# Patient Record
Sex: Female | Born: 1975 | Race: Asian | Marital: Married | Smoking: Never smoker
Health system: Southern US, Community
[De-identification: ages and names within clinical notes are randomized; demographics above are authoritative.]

## PROBLEM LIST (undated history)

## (undated) DIAGNOSIS — R112 Nausea with vomiting, unspecified: Secondary | ICD-10-CM

## (undated) DIAGNOSIS — B029 Zoster without complications: Secondary | ICD-10-CM

## (undated) DIAGNOSIS — L659 Nonscarring hair loss, unspecified: Secondary | ICD-10-CM

## (undated) DIAGNOSIS — Z9889 Other specified postprocedural states: Secondary | ICD-10-CM

## (undated) DIAGNOSIS — T8859XA Other complications of anesthesia, initial encounter: Secondary | ICD-10-CM

## (undated) DIAGNOSIS — K219 Gastro-esophageal reflux disease without esophagitis: Secondary | ICD-10-CM

## (undated) DIAGNOSIS — Z803 Family history of malignant neoplasm of breast: Secondary | ICD-10-CM

## (undated) DIAGNOSIS — M5010 Cervical disc disorder with radiculopathy, unspecified cervical region: Secondary | ICD-10-CM

## (undated) DIAGNOSIS — R61 Generalized hyperhidrosis: Secondary | ICD-10-CM

## (undated) DIAGNOSIS — N941 Unspecified dyspareunia: Secondary | ICD-10-CM

## (undated) HISTORY — DX: Nonscarring hair loss, unspecified: L65.9

## (undated) HISTORY — DX: Gastro-esophageal reflux disease without esophagitis: K21.9

## (undated) HISTORY — PX: OTHER SURGICAL HISTORY: SHX169

## (undated) HISTORY — PX: BLADDER EXTROPHY RECONSTRUCTION PELVIC SAGITTAL OSTEOTOMY: SHX1236

---

## 2003-06-05 DIAGNOSIS — R87629 Unspecified abnormal cytological findings in specimens from vagina: Secondary | ICD-10-CM

## 2003-06-05 HISTORY — DX: Unspecified abnormal cytological findings in specimens from vagina: R87.629

## 2007-12-04 ENCOUNTER — Inpatient Hospital Stay (HOSPITAL_COMMUNITY): Admission: AD | Admit: 2007-12-04 | Discharge: 2007-12-04 | Payer: Self-pay | Admitting: Obstetrics and Gynecology

## 2008-04-13 ENCOUNTER — Inpatient Hospital Stay (HOSPITAL_COMMUNITY): Admission: AD | Admit: 2008-04-13 | Discharge: 2008-04-13 | Payer: Self-pay | Admitting: Obstetrics and Gynecology

## 2008-07-22 ENCOUNTER — Inpatient Hospital Stay (HOSPITAL_COMMUNITY): Admission: AD | Admit: 2008-07-22 | Discharge: 2008-07-25 | Payer: Self-pay | Admitting: Obstetrics and Gynecology

## 2008-07-25 ENCOUNTER — Inpatient Hospital Stay (HOSPITAL_COMMUNITY): Admission: AD | Admit: 2008-07-25 | Discharge: 2008-07-25 | Payer: Self-pay | Admitting: Obstetrics and Gynecology

## 2010-09-19 LAB — CBC
MCHC: 33.8 g/dL (ref 30.0–36.0)
MCHC: 34.4 g/dL (ref 30.0–36.0)
MCV: 89.4 fL (ref 78.0–100.0)
MCV: 90.2 fL (ref 78.0–100.0)
Platelets: 215 10*3/uL (ref 150–400)
RBC: 4.37 MIL/uL (ref 3.87–5.11)
RDW: 14.4 % (ref 11.5–15.5)
RDW: 14.9 % (ref 11.5–15.5)
WBC: 15 10*3/uL — ABNORMAL HIGH (ref 4.0–10.5)

## 2010-09-19 LAB — URINE CULTURE: Special Requests: NEGATIVE

## 2010-09-19 LAB — CREATININE, SERUM
Creatinine, Ser: 0.76 mg/dL (ref 0.4–1.2)
GFR calc Af Amer: >60 mL/min (ref >60)

## 2010-09-19 LAB — RPR: RPR Ser Ql: NONREACTIVE

## 2010-10-17 NOTE — Discharge Summary (Signed)
NAMEKENADEE, GATES                 ACCOUNT NO.:  [id number]   MEDICAL RECORD NO.:  [id number]          PATIENT TYPE:  INP   LOCATION:  9108                          FACILITY:  [facility]   PHYSICIAN:  Janine Limbo, M.D.DATE OF BIRTH:  23-Oct-1975   DATE OF ADMISSION:  07/22/2008  DATE OF DISCHARGE:  07/25/2008                               DISCHARGE SUMMARY   Ms. Fuller is a 35 year old gravida 1 now para 1-0-0-1 who delivered a  daughter, Truitt Leep on February 19 at 12 noon who weighed 6 pounds 6 ounces  with Apgars of 8 at 1 minute and 9 at 5 minutes.  She was delivered by  vacuum extraction.   ADMISSION DIAGNOSES:  1. Singleton intrauterine pregnancy at 38.6 weeks.  2. Penicillin allergy.  3. Frequent urinary tract infections with a long history of      genitourinary discomfort and pain.  4. Active labor.   DISCHARGE DIAGNOSES:  1. Two days status post vacuum extraction at term secondary to      prolonged second stage.  2. Midline episiotomy with third-degree laceration extension.  3. Difficult repair with vaginal packing secondary to marked tissue      friability.  4. Endometriosis, postpartum.  5. Urinary retention.  6. Anemia.   PERTINENT LABS:  Ms. Hypolite is A positive with rubella immune status.  White blood cell count 15.0 (10.3), hemoglobin 7.1 (13.2), hematocrit  20.7 ( 39.1), platelets 128,000 (215,000).   COURSE OF STAY:  Ms. Cantero arrived on February 18 in active labor.  She  progressed to delivery at noon on 19th after 3 hours of pushing with a  vacuum extraction.  Delivery by Dr. Pennie Rushing.  On 19th and 20th, she had  issues with pain control and developed a fever on 19th and was treated  with clindamycin and gentamicin.  On 20th, she had a problem with  urinary retention and Dr. Pennie Rushing placed a Foley catheter to be left in  overnight.  On 21st, the Foley catheter was removed and the patient was  able to successfully void several times before leaving to go home.   Exam  on day of discharge, her vital signs were 97.9, pulse 109, respiratory  rate 18, blood pressure 94/54.  She was not having any dizziness without  hemoglobin of 7.1.  Her lungs were clear to auscultation bilaterally.  Her heart had a regular rate and rhythm without murmur.  Her breasts  were soft and filling.  Her nipples were intact.  Her abdomen was soft.  Her fundus was firm -1 cm below the umbilicus.  Her perineum was intact  with minimal swelling and her lochia was light.  She did have mild edema  of extremities x4 with DTRs normal at +1, +1, and negative Homans  bilaterally.   DISCHARGE MEDICATIONS:  1. Motrin 600 mg.  2. Percocet.  3. Integra F iron supplementation.  4. Prenatal vitamins which she already has.  5. Over-the-counter Colace.   Ms. Kitner was provided with the postpartum discharge instruction  booklet which was reviewed with her with specific emphasis on the  danger  signs of postpartum and constipation prevention measures.  Ms. Atilano is  to follow up with Dr. Pennie Rushing in the office in approximately 1-1/2 weeks  and to call us sooner p.r.n. if any problems.  She seemed to have  received full benefit of her hospitalization for the birth of her  daughter, Truitt Leep.      Eulogio Bear, CNM      Janine Limbo, M.D.  Electronically Signed    JM/MEDQ  D:  07/25/2008  T:  07/26/2008  Job:  [id number]

## 2010-10-17 NOTE — Op Note (Signed)
[name], [name]                 ACCOUNT NO.:  [id number]   MEDICAL RECORD NO.:  [id number]          PATIENT TYPE:  MAT   LOCATION:  MATC                          FACILITY:  [facility]   PHYSICIAN:  Hal Morales, M.D.DATE OF BIRTH:  March 17, 1976   DATE OF PROCEDURE:  07/24/2008  DATE OF DISCHARGE:                               OPERATIVE REPORT   PREOPERATIVE DIAGNOSES:  Intrauterine pregnancy at term, prolonged  second stage labor.   POSTOPERATIVE DIAGNOSES:  Intrauterine pregnancy at term, prolonged  second stage labor.   OPERATION:  Kiwi vacuum-assisted vaginal delivery, second-degree midline  episiotomy with extension to third-degree, repair of third-degree  perineal laceration, vaginal packing.   SURGEON:  Vanessa P. Haygood, MD   ANESTHESIA:  Epidural.   ESTIMATED BLOOD LOSS:  1000 mL.   COMPLICATIONS:  Multiple vaginal lacerations which occurred during  pushing and bled significantly.  The tissue was so fragile that it would  not except and hold sutures.   FINDINGS:  The patient was delivered of a female infant whose name is  Bhutan, weighing 6 pounds 6 ounces with Apgars of 8 and nine at 1 at five  minutes respectively.   PROCEDURE:  The patient had been pushing for approximately 3 hours  though she admitted during the first hour of pushing, she did not  understand how to push effectively.  At this time, the vertex was at the  +3 to +4 station and in the right occiput anterior position.  The Foley  catheter had remained in place until the decision had been made to  proceed with vacuum-assisted vaginal delivery.  At least 2 hour pushing  marked, a long discussion had been held with the patient and her husband  and her mother concerning options for further management, which included  continued pushing, an attempt then at vacuum-assisted vaginal delivery  and cesarean section.  The risks and benefits of each of those options  was explained in detail.  She had made  decision that she would continue  pushing for an additional hour and pushed effectively to bring the  vertex to the aforementioned +3 to +4 station.  She did seem to be  unable to make further progress with further pushing.  The Foley  catheter was removed from the patient who was already in the lithotomy  position.  Perineum was cleansed.  The Kiwi vacuum extractor was then  placed over the fetal vertex and next contraction with a combination of  suction and maternal expulsive effort.  Further movement was achieved.  It was noted, however, that there was significant bleeding from the  aforementioned vaginal tear and there was blood within the suction  tubing which was limiting the ability of the Kiwi to maintain its seal.  Another fresh Kiwi vacuum extractor was then used and over the next  several contractions, the fetal vertex was advanced further.  Once the  vertex began to extend the perineum, it became clear that the perineum  became the limiting factor for delivery and a second-degree midline  episiotomy was performed.  Once that occurred, fetal vertex  was  delivered over the second-degree episiotomy.  The remainder of the  infant was delivered with a combination of gentle traction and maternal  expulsive efforts.  The infant was suctioned, and the cord clamped and  then cut by the father of the baby.  The infant was given to the mother  for initial infant bonding.  The appropriate cord blood was drawn from  the umbilical cord.  An assessment of the perineum was made while  awaiting for the placenta to spontaneously detach.  Once that detachment  had occurred, gentle traction and maternal expulsive efforts allowed  delivery of the placenta.  It appeared to be completely intact.  No  cervical lacerations could be noted, but multiple small lacerations of  the vaginal mucosa were noted.  There was a second-degree laceration of  the vaginal mucosa, which extended from the episiotomy  incision.  Initial repair of that the area was achieved with a running interlocking  suture with continued problems with the suture pulling through the  vaginal tissue.  That the area was reapproximated, however, there were  several very small areas where the vaginal mucosa was essentially  denuded.  The third- degree laceration was repaired first by repairing  the rectal sphincter with 2-0 Vicryl.  Then, repairing the mucosa of the  rectum onto the perineum with subcuticular sutures of 3-0 Vicryl.  The  subcutaneous tissue of the perineum was repaired with interrupted  sutures of 2-0 Vicryl and the completion of the skin incision with  subcuticular sutures of 3-0 Vicryl was undertaken.  At that time, the  decision was made to place vaginal packing to achieve hemostasis in the  vagina because of the marked fragility of the vaginal mucosa.  This was  achieved with 2-inch plain vaginal packing with adequate hemostasis.  A  Foley catheter was replaced in the bladder to avoid urinary retention in  the event that there should be significant perineal swelling and  obstruction from the vaginal packing.  At this time, hemostasis was  noted to be adequate and the patient remained in the labor, delivery,  and recovery area for initial recovery.  The infant went to the full-  term nursery.      Hal Morales, M.D.  Electronically Signed     VPH/MEDQ  D:  07/24/2008  T:  07/25/2008  Job:  [id number]

## 2010-10-17 NOTE — H&P (Signed)
NAMETIFFANY, [name]                 ACCOUNT NO.:  [id number]   MEDICAL RECORD NO.:  [id number]          PATIENT TYPE:  INP   LOCATION:  9174                          FACILITY:  [facility]   PHYSICIAN:  Janine Limbo, M.D.DATE OF BIRTH:  Feb 12, 1976   DATE OF ADMISSION:  07/22/2008  DATE OF DISCHARGE:                              HISTORY & PHYSICAL   This is a 35 year old gravida 1, para 0 at 38-6/7 weeks who presents to  the office with contractions every 5 minutes for 3 hours.  Cervix 2 days  ago was 1 cm.  She denies leaking or bleeding and reports positive fetal  movement.  Pregnancy has been followed by Dr. Pennie Rushing and remarkable  for:  1. History of first trimester spotting  2. Irregular cycles.  3. EIF in the fetal heart with normal first-trimester testing.   ALLERGIES:  PENICILLIN.   OB history is negative.   Medical history is remarkable for abnormal Pap in previous years with no  treatment.  Childhood varicella.   Surgical history is remarkable for wisdom teeth at age of 48.   Family history is remarkable for several family members with heart  disease and varicosities.  Several family members with type 2 diabetes.  Several family members with hypothyroidism and mother with breast cancer  and skin cancer.   Genetic history is negative.   SOCIAL HISTORY:  The patient is married to [name] who is works as  MD and she works as an Pensions consultant.  She is of the Hindu faith.  She denies  any alcohol, tobacco, or drug use.   Prenatal Labs:  Hemoglobin 14.5, platelets 248.  Blood type A+, antibody  screen negative, RPR nonreactive, rubella immune, hepatitis negative,  HIV negative.  Pap test normal.  Gonorrhea negative.  Chlamydia  negative.   HISTORY OF CURRENT PREGNANCY:  The patient entered care at 63 weeks'  gestation.  She was treated for UTI in the first trimester, first  trimester screen was normal as was AFP.  Anatomy ultrasound at 19 weeks  was normal except for an  EIF that was seen in the fetal heart, positive  nasal bone was seen, cervix was normal, and Glucola at 26 weeks was  normal.  She was placed on Macrobid for UTI suppression.  She had an  ultrasound for size less than dates at 36 weeks that showed 69  percentile growth with normal fluid.  She had group B strep negative at  term.  She had another ultrasound at 38 weeks showing normal AFI and she  presents today in labor.   OBJECTIVE:  HEENT:  Normal limits.  Thyroid normal, not enlarged.  CHEST:  Clear to auscultation.  HEART:  Regular rate and rhythm.  ABDOMEN:  Gravid.  Fetal heart rate is 150.  Cervix with a difficult  exam at 4 cm, 90% effaced, -2 station with a vertex presentation.  Membranes are intact.  EXTREMITIES:  Normal limits.   ASSESSMENT:  1. Intrauterine pregnancy at 38-6/7 weeks.  2. Early active labor.  3. Desires epidural.   PLAN:  1.  Admit to birthing suites per Dr. Stefano Gaul.  2. Epidural.  3. Routine MD orders.      Marie L. Williams, C.N.M.      Janine Limbo, M.D.  Electronically Signed    MLW/MEDQ  D:  07/22/2008  T:  07/23/2008  Job:  [id number]

## 2011-03-06 LAB — URINE MICROSCOPIC-ADD ON

## 2011-03-06 LAB — CBC
Hemoglobin: 12.5
MCV: 91.9
RBC: 4.11
WBC: 8.8

## 2011-03-06 LAB — URINALYSIS, ROUTINE W REFLEX MICROSCOPIC: Ketones, ur: 15 — AB

## 2011-03-06 LAB — WET PREP, GENITAL
Clue Cells Wet Prep HPF POC: NONE SEEN
Yeast Wet Prep HPF POC: NONE SEEN

## 2013-03-04 ENCOUNTER — Ambulatory Visit: Payer: Self-pay | Admitting: Radiology

## 2014-01-02 HISTORY — PX: ABDOMINAL SURGERY: SHX537

## 2014-01-09 ENCOUNTER — Ambulatory Visit: Payer: Self-pay

## 2014-01-09 LAB — URINALYSIS, COMPLETE
BLOOD: NEGATIVE
Bacteria: NONE SEEN
Bilirubin,UR: NEGATIVE
Glucose,UR: NEGATIVE mg/dL (ref 0–75)
KETONE: NEGATIVE
NITRITE: NEGATIVE
PH: 7 (ref 4.5–8.0)
PROTEIN: NEGATIVE
RBC,UR: 2 /HPF (ref 0–5)
SPECIFIC GRAVITY: 1.015 (ref 1.003–1.030)
Squamous Epithelial: 7
WBC UR: 19 /HPF (ref 0–5)

## 2014-01-09 LAB — CBC WITH DIFFERENTIAL/PLATELET
BASOS ABS: 0.1 10*3/uL (ref 0.0–0.1)
BASOS PCT: 1.1 %
EOS ABS: 0.4 10*3/uL (ref 0.0–0.7)
Eosinophil %: 6.6 %
HCT: 44.6 % (ref 35.0–47.0)
HGB: 14.7 g/dL (ref 12.0–16.0)
LYMPHS ABS: 2.5 10*3/uL (ref 1.0–3.6)
Lymphocyte %: 44 %
MCH: 30.1 pg (ref 26.0–34.0)
MCHC: 33.1 g/dL (ref 32.0–36.0)
MCV: 91 fL (ref 80–100)
Monocyte #: 0.4 x10 3/mm (ref 0.2–0.9)
Monocyte %: 6.3 %
Neutrophil #: 2.4 10*3/uL (ref 1.4–6.5)
Neutrophil %: 42 %
Platelet: 252 10*3/uL (ref 150–440)
RBC: 4.89 10*6/uL (ref 3.80–5.20)
RDW: 13.3 % (ref 11.5–14.5)
WBC: 5.8 10*3/uL (ref 3.6–11.0)

## 2014-01-09 LAB — BASIC METABOLIC PANEL
ANION GAP: 10 (ref 7–16)
BUN: 7 mg/dL (ref 7–18)
CHLORIDE: 104 mmol/L (ref 98–107)
Calcium, Total: 9.1 mg/dL (ref 8.5–10.1)
Co2: 24 mmol/L (ref 21–32)
Creatinine: 0.89 mg/dL (ref 0.60–1.30)
GLUCOSE: 97 mg/dL (ref 65–99)
Osmolality: 274 (ref 275–301)
Potassium: 4.6 mmol/L (ref 3.5–5.1)
SODIUM: 138 mmol/L (ref 136–145)

## 2014-12-07 ENCOUNTER — Encounter: Payer: Self-pay | Admitting: Pulmonary Disease

## 2014-12-07 ENCOUNTER — Ambulatory Visit (INDEPENDENT_AMBULATORY_CARE_PROVIDER_SITE_OTHER): Payer: PRIVATE HEALTH INSURANCE | Admitting: Pulmonary Disease

## 2014-12-07 ENCOUNTER — Institutional Professional Consult (permissible substitution): Payer: Self-pay | Admitting: Pulmonary Disease

## 2014-12-07 ENCOUNTER — Ambulatory Visit
Admission: RE | Admit: 2014-12-07 | Discharge: 2014-12-07 | Disposition: A | Discharge status: Home / Self Care | Payer: PRIVATE HEALTH INSURANCE | Source: Ambulatory Visit | Attending: Pulmonary Disease | Admitting: Pulmonary Disease

## 2014-12-07 VITALS — BP 118/64 | HR 71 | Ht 60.0 in | Wt 123.0 lb

## 2014-12-07 DIAGNOSIS — R058 Other specified cough: Secondary | ICD-10-CM | POA: Insufficient documentation

## 2014-12-07 DIAGNOSIS — J309 Allergic rhinitis, unspecified: Secondary | ICD-10-CM | POA: Insufficient documentation

## 2014-12-07 DIAGNOSIS — R05 Cough: Secondary | ICD-10-CM

## 2014-12-07 DIAGNOSIS — R059 Cough, unspecified: Secondary | ICD-10-CM

## 2014-12-07 DIAGNOSIS — K219 Gastro-esophageal reflux disease without esophagitis: Secondary | ICD-10-CM

## 2014-12-07 DIAGNOSIS — J3089 Other allergic rhinitis: Secondary | ICD-10-CM

## 2014-12-07 LAB — PULMONARY FUNCTION TEST
DL/VA % PRED: 132 %
DL/VA: 5.61 ml/min/mmHg/L
DLCO UNC % PRED: 105 %
DLCO UNC: 19.97 ml/min/mmHg
FEF 25-75 Post: 2.99 L/sec
FEF 25-75 Pre: 2.92 L/sec
FEF2575-%Change-Post: 2 %
FEV1-%Change-Post: 1 %
FEV1-PRE: 2.35 L
FEV1-Post: 2.39 L
FEV1FVC-%CHANGE-POST: -1 %
FEV6-%Change-Post: 2 %
FEV6-PRE: 2.67 L
FEV6-Post: 2.74 L
FEV6FVC-%CHANGE-POST: 0 %
FVC-%CHANGE-POST: 2 %
FVC-PRE: 2.67 L
FVC-Post: 2.74 L
POST FEV1/FVC RATIO: 87 %
POST FEV6/FVC RATIO: 100 %
Pre FEV1/FVC ratio: 88 %
Pre FEV6/FVC Ratio: 100 %
RV % pred: 100 %
RV: 1.32 L
TLC % PRED: 90 %
TLC: 4.04 L

## 2014-12-07 NOTE — Progress Notes (Signed)
PFT performed today. 

## 2014-12-07 NOTE — Assessment & Plan Note (Addendum)
She has acid reflux contributing to her cough based on the fact that she can feel heartburn and her cough is worse when she's lying flat.  Plan: Pepcid over-the-counter twice a day Acid reflux lifestyle modifications reviewed Raise the head of the bed 30

## 2014-12-07 NOTE — Addendum Note (Signed)
Addended by: Alease FrameARTER, SONYA S on: 12/07/2014 05:02 PM   Modules accepted: Orders

## 2014-12-07 NOTE — Addendum Note (Signed)
Addended by: Alease FrameARTER, Kendallyn Lippold S on: 12/07/2014 03:39 PM   Modules accepted: Orders

## 2014-12-07 NOTE — Assessment & Plan Note (Signed)
I explained to her that I thought it would be reasonable for her to see an allergist because of the progressive nature of her allergies. However in the meantime I have recommended conservative therapy as outlined below: Generic Zyrtec daily Nasacort daily Saline rinses when symptoms are worse

## 2014-12-07 NOTE — Assessment & Plan Note (Signed)
Her lung exam is clear today, a chest x-ray from a couple years ago was personally reviewed and there is no evidence of lung disease. So do not think that she has a lung disease which is contributing to her cough. She has postnasal drip, ongoing laryngeal irritation with hoarseness, and likely acid reflux which are all contribute to her cough.  Because this problem has been progressive over the last year and a half I would like to get pulmonary function testing to ensure there is no evidence of an underlying lung disease. Further, I like to repeat a chest x-ray as I have not been able to see a recent one aside from the partial images from 2014.  Plan: Chest x-ray Pulmonary function testing Voice rest was encouraged for the laryngeal irritation Allergic rhinitis treatment as outlined below Acid reflux treatment as outlined below Follow-up when necessary

## 2014-12-07 NOTE — Patient Instructions (Signed)
For the sinus congestion related to the cough: Use Neil Med rinses with distilled water at least twice per day using the instructions on the package. 1/2 hour after using the [facility] rinse, use Nasacort two puffs in each nostril once per day.  Remember that the Nasacort can take 1-2 weeks to work after regular use. Use generic zyrtec (cetirizine) every day.  If this doesn't help, then stop taking it and use chlorpheniramine-phenylephrine combination tablets.  You need to try to suppress your cough to allow your larynx (voice box) to heal.  For three days don't talk, laugh, sing, or clear your throat. Do everything you can to suppress the cough during this time. Use hard candies (sugarless Jolly Ranchers) or non-mint or non-menthol containing cough drops during this time to soothe your throat.  Use a cough suppressant (Delsym or what I have prescribed you) around the clock during this time.  After three days, gradually increase the use of your voice and back off on the cough suppressants.   Follow the acid reflux lifestyle modification we gave you  We will call you with the results of the Chest X-ray  Call us if you are not getting better and we will see you back

## 2014-12-07 NOTE — Progress Notes (Signed)
Subjective:    Patient ID: [name], female    DOB: 06/24/1975, 39 y.o.   MRN: [id number]  HPI Chief Complaint  Patient presents with  . Advice Only    Pt referred by Dr. Allena KatzPatel for chronic cough X2 years.  Pt states she has coughing spells that last up to 6 weeks at a time.      This is a pleasant nonsmoker who comes to my clinic today for evaluation of chronic cough which is happening for the last 2 years. She states that she never had a lung problem as a child but she does remember having some respiratory infections. She also had an anaphylactic reaction to penicillin as a child.  She states that for the last 1-2 years typically will happen is her children will get a respiratory infection that only last for 3 or 4 days but then she will get the infection. She says this typically starts with sinus congestion as well as some phlegm in her throat. This will lead to a cough which is initially productive of mucus but then later a deep dry cough which last for weeks. She says the cough is nearly constant though it may be a little bit worse when she lies flat at night. Sometimes if she props her head of her bed up it will lead to a 25% improvement in the cough but will still persist while lying flat. The cough is not related to eating as far she can tell. She says that when she talks a lot she thinks this may make the cough worse. She also has noted hoarseness with this. Last year she was evaluated by an ear nose and throat physician and no clear cause of the cough could be identified. She was treated with albuterol but she said that this did not help significantly. She does have allergy rhinitis and she takes Zyrtec for this. This did help with the cough some a year ago but lately this has not been helping quite some much. She did try Allegra but that was not quite as good.  She says the allergic rhinitis has been persistent for the last 1-2 years and she has yet to see an allergist. She also notes  heartburn symptoms. She says that certain meals may make this a bit worse. Cough may also be worsened by popcorn from time to time.  No past medical history on file.   Family History  Problem Relation Age of Onset  . Heart disease Father [id number]  . Heart disease Paternal Grandmother   . Heart disease Paternal Uncle   . Cancer Mother     breast     History   Social History  . Marital Status: Married    Spouse Name: N/A  . Number of Children: N/A  . Years of Education: N/A   Occupational History  . Not on file.   Social History Main Topics  . Smoking status: Never Smoker   . Smokeless tobacco: Never Used  . Alcohol Use: Not on file  . Drug Use: Not on file  . Sexual Activity: Not on file   Other Topics Concern  . Not on file   Social History Narrative  . No narrative on file     Allergies  Allergen Reactions  . Penicillins Anaphylaxis     No outpatient prescriptions prior to visit.   No facility-administered medications prior to visit.       Review of Systems  Constitutional: Negative for fever  and unexpected weight change.  HENT: Positive for congestion. Negative for dental problem, ear pain, nosebleeds, postnasal drip, rhinorrhea, sinus pressure, sneezing, sore throat and trouble swallowing.   Eyes: Negative for redness and itching.  Respiratory: Positive for cough. Negative for chest tightness, shortness of breath and wheezing.   Cardiovascular: Negative for palpitations and leg swelling.  Gastrointestinal: Negative for nausea and vomiting.  Genitourinary: Negative for dysuria.  Musculoskeletal: Negative for joint swelling.  Skin: Negative for rash.  Neurological: Negative for headaches.  Hematological: Does not bruise/bleed easily.  Psychiatric/Behavioral: Negative for dysphoric mood. The patient is not nervous/anxious.        Objective:   Physical Exam Filed Vitals:   12/07/14 1425  BP: 118/64  Pulse: 71  Height: 5' (1.524 m)  Weight: 123 lb  (55.792 kg)  SpO2: 100%  RA  Gen: well appearing, no acute distress HENT: NCAT, OP clear, neck supple without masses Eyes: PERRL, EOMi Lymph: no cervical lymphadenopathy PULM: CTA B CV: RRR, no mgr, no JVD GI: BS+, soft, nontender, no hsm Derm: no rash or skin breakdown MSK: normal bulk and tone Neuro: A&Ox4, CN II-XII intact, strength 5/5 in all 4 extremities Psyche: normal mood and affect  2014 chest x-ray (shoulder?) Images reviewed, only partial images of the lung seen but there is no clear evidence of lung disease noted        Assessment & Plan:  Upper airway cough syndrome Her lung exam is clear today, a chest x-ray from a couple years ago was personally reviewed and there is no evidence of lung disease. So do not think that she has a lung disease which is contributing to her cough. She has postnasal drip, ongoing laryngeal irritation with hoarseness, and likely acid reflux which are all contribute to her cough.  Because this problem has been progressive over the last year and a half I would like to get pulmonary function testing to ensure there is no evidence of an underlying lung disease. Further, I like to repeat a chest x-ray as I have not been able to see a recent one aside from the partial images from 2014.  Plan: Chest x-ray Pulmonary function testing Voice rest was encouraged for the laryngeal irritation Allergic rhinitis treatment as outlined below Acid reflux treatment as outlined below Follow-up when necessary  Allergic rhinitis I explained to her that I thought it would be reasonable for her to see an allergist because of the progressive nature of her allergies. However in the meantime I have recommended conservative therapy as outlined below: Generic Zyrtec daily Nasacort daily Saline rinses when symptoms are worse  GERD (gastroesophageal reflux disease) She has acid reflux contributing to her cough based on the fact that she can feel heartburn and her  cough is worse when she's lying flat.  Plan: Pepcid over-the-counter twice a day Acid reflux lifestyle modifications reviewed Raise the head of the bed 30    Current outpatient prescriptions:  .  cetirizine (ZYRTEC) 10 MG tablet, Take 10 mg by mouth daily as needed for allergies., Disp: , Rfl:

## 2014-12-08 ENCOUNTER — Telehealth: Payer: Self-pay | Admitting: Pulmonary Disease

## 2014-12-08 NOTE — Telephone Encounter (Signed)
Patient calling back for Xray result. Patient notified.  No questions or concerns at this time. Nothing further needed.

## 2017-11-07 ENCOUNTER — Ambulatory Visit
Admission: RE | Admit: 2017-11-07 | Discharge: 2017-11-07 | Disposition: A | Discharge status: Home / Self Care | Payer: Self-pay | Source: Ambulatory Visit | Attending: Diagnostic Radiology | Admitting: Diagnostic Radiology

## 2017-11-07 ENCOUNTER — Other Ambulatory Visit (HOSPITAL_COMMUNITY): Payer: Self-pay | Admitting: Diagnostic Radiology

## 2017-11-07 DIAGNOSIS — N63 Unspecified lump in unspecified breast: Secondary | ICD-10-CM

## 2017-11-08 ENCOUNTER — Other Ambulatory Visit (HOSPITAL_COMMUNITY): Payer: Self-pay | Admitting: Diagnostic Radiology

## 2017-11-08 ENCOUNTER — Ambulatory Visit
Admission: RE | Admit: 2017-11-08 | Discharge: 2017-11-08 | Disposition: A | Discharge status: Home / Self Care | Payer: Self-pay | Source: Ambulatory Visit | Attending: Diagnostic Radiology | Admitting: Diagnostic Radiology

## 2017-11-08 ENCOUNTER — Inpatient Hospital Stay
Admission: RE | Admit: 2017-11-08 | Discharge: 2017-11-08 | Disposition: A | Discharge status: Home / Self Care | Payer: Self-pay | Source: Ambulatory Visit | Attending: Diagnostic Radiology | Admitting: Diagnostic Radiology

## 2017-11-08 DIAGNOSIS — N63 Unspecified lump in unspecified breast: Secondary | ICD-10-CM

## 2018-02-13 ENCOUNTER — Ambulatory Visit: Payer: PRIVATE HEALTH INSURANCE | Admitting: Internal Medicine

## 2021-05-17 ENCOUNTER — Other Ambulatory Visit (HOSPITAL_COMMUNITY): Payer: Self-pay | Admitting: Internal Medicine

## 2021-06-09 ENCOUNTER — Other Ambulatory Visit: Payer: Self-pay

## 2021-06-09 ENCOUNTER — Ambulatory Visit
Admission: RE | Admit: 2021-06-09 | Discharge: 2021-06-09 | Disposition: A | Discharge status: Home / Self Care | Payer: PRIVATE HEALTH INSURANCE | Source: Ambulatory Visit | Attending: Internal Medicine | Admitting: Internal Medicine

## 2021-06-09 DIAGNOSIS — Z8249 Family history of ischemic heart disease and other diseases of the circulatory system: Secondary | ICD-10-CM | POA: Insufficient documentation

## 2021-06-09 DIAGNOSIS — E7800 Pure hypercholesterolemia, unspecified: Secondary | ICD-10-CM | POA: Insufficient documentation

## 2021-06-09 DIAGNOSIS — Z136 Encounter for screening for cardiovascular disorders: Secondary | ICD-10-CM | POA: Insufficient documentation

## 2021-07-04 ENCOUNTER — Other Ambulatory Visit: Payer: Self-pay | Admitting: Obstetrics and Gynecology

## 2021-07-04 DIAGNOSIS — R1032 Left lower quadrant pain: Secondary | ICD-10-CM

## 2021-07-07 ENCOUNTER — Other Ambulatory Visit: Payer: PRIVATE HEALTH INSURANCE

## 2021-07-10 ENCOUNTER — Ambulatory Visit
Admission: RE | Admit: 2021-07-10 | Discharge: 2021-07-10 | Disposition: A | Discharge status: Home / Self Care | Payer: BC Managed Care – PPO | Source: Ambulatory Visit | Attending: Obstetrics and Gynecology | Admitting: Obstetrics and Gynecology

## 2021-07-10 ENCOUNTER — Other Ambulatory Visit: Payer: PRIVATE HEALTH INSURANCE

## 2021-07-10 DIAGNOSIS — R1032 Left lower quadrant pain: Secondary | ICD-10-CM

## 2022-06-08 ENCOUNTER — Ambulatory Visit (INDEPENDENT_AMBULATORY_CARE_PROVIDER_SITE_OTHER): Payer: BC Managed Care – PPO | Admitting: Neurosurgery

## 2022-06-08 ENCOUNTER — Encounter: Payer: Self-pay | Admitting: Neurosurgery

## 2022-06-08 ENCOUNTER — Telehealth: Payer: Self-pay

## 2022-06-08 VITALS — BP 115/74 | HR 98 | Ht 60.0 in | Wt 133.0 lb

## 2022-06-08 DIAGNOSIS — G545 Neuralgic amyotrophy: Secondary | ICD-10-CM

## 2022-06-08 DIAGNOSIS — R200 Anesthesia of skin: Secondary | ICD-10-CM

## 2022-06-08 DIAGNOSIS — M5412 Radiculopathy, cervical region: Secondary | ICD-10-CM

## 2022-06-08 NOTE — Progress Notes (Signed)
Referring Physician:  No referring provider defined for this encounter.  Primary Physician:  Pcp, No  History of Present Illness: 06/08/2022 Ms. [name] is here today with a chief complaint of left arm numbness.  Over the past 3 to 4 weeks, she has had varying symptoms that started with severe pain in her left arm and is now switched over to numbness.  She has numbness that occurs intermittently.  It was previously more longstanding and happening less often now.  She is now having numbness starting in her left shoulder down to her first and second digit that sometimes goes to all 5 fingers of her hand and occurs very quickly but then stops very quickly prickly when she shakes out her arm.  Straightening her arm also helps.  Bending her arm and sleeping in certain positions makes it worse.  She has had some weakness and has dropped her coffee mug, but attributes this more to not being able to feel the cup in her hand.  She does report some pain into her left shoulder blade.  Left arm weakness and numbness Numbness in left arm to her index and thumb Weakness started last week, but only happens during the episodes of numbness   Bowel/Bladder Dysfunction: none  Conservative measures:  Physical therapy:  has not participated Multimodal medical therapy including regular antiinflammatories: none  Injections:  has not received epidural steroid injections  Past Surgery: denies  [name] has no symptoms of cervical myelopathy.  The symptoms are causing a significant impact on the patient's life.   I have utilized the care everywhere function in epic to review the outside records available from external health systems.  Review of Systems:  A 10 point review of systems is negative, except for the pertinent positives and negatives detailed in the HPI.  Past Medical History: Past Medical History:  Diagnosis Date   Alopecia    GERD (gastroesophageal reflux disease)     Past  Surgical History:   Allergies: Allergies as of 06/08/2022 - Review Complete 06/08/2022  Allergen Reaction Noted   Gadobenate Nausea And Vomiting and Nausea Only 04/27/2013   Penicillins Anaphylaxis, Other (See Comments), and Shortness Of Breath 12/07/2014    Medications: Current Meds  Medication Sig   spironolactone (ALDACTONE) 50 MG tablet Take 50 mg by mouth 2 (two) times daily.    Social History: Social History   Tobacco Use   Smoking status: Never   Smokeless tobacco: Never  Vaping Use   Vaping Use: Never used    Family Medical History: Family History  Problem Relation Age of Onset   Heart disease Father 56   Heart disease Paternal Grandmother    Heart disease Paternal Uncle    Cancer Mother        breast    Physical Examination: Vitals:   06/08/22 1259  BP: 115/74  Pulse: 98    General: Patient is well developed, well nourished, calm, collected, and in no apparent distress. Attention to examination is appropriate.  Neck:   Supple.  Full range of motion with discomfort on rotation to the left.  Respiratory: Patient is breathing without any difficulty.   NEUROLOGICAL:     Awake, alert, oriented to person, place, and time.  Speech is clear and fluent.   Cranial Nerves: Pupils equal round and reactive to light.  Facial tone is symmetric.  Facial sensation is symmetric. Shoulder shrug is symmetric. Tongue protrusion is midline.  There is no pronator drift.  ROM of spine: full.    Strength: Side Biceps Triceps Deltoid Interossei Grip Wrist Ext. Wrist Flex.  R 5 5 5 5 5 5 5   L 5 5 5 5 5 5 5    Side Iliopsoas Quads Hamstring PF DF EHL  R 5 5 5 5 5 5   L 5 5 5 5 5 5    Reflexes are 1+ and symmetric at the biceps, triceps, brachioradialis, patella and achilles.   Hoffman's is absent.   Bilateral lower extremity sensation is intact to light touch .   She has diminished light touch sensation throughout her left upper extremity in a nondermatomal fashion. No  evidence of dysmetria noted.  Gait is normal.     Medical Decision Making  Imaging: None to review  I have personally reviewed the images and agree with the above interpretation.  Assessment and Plan: Ms. Blow is a pleasant 47 y.o. female with left arm numbness that started with severe pain and has transitioned to intermittent numbness.  She has dropped some items but has no demonstrable weakness.  She does have some discomfort when she turns her head towards the left, which would support a possible cervical radiculopathy.  She has some symptoms concerning for cervical radiculopathy which may be present but that would not fully explain the extent of her numbness.  She has numbness that extends outside the bounds of the C6 dermatome.  I am somewhat concerned that she has developed Parsonage-Turner syndrome and is in the subacute phase of this.  She has not developed amyotrophy at this point.  To further delineate this, I have recommended a nerve conduction study as well as an MRI scan of the neck and brachial plexus.  Will work on getting this scheduled.  We discussed a steroid taper, but she has had complications related to utilization of steroids in the past.  Thus, we will start with naproxen 2 pills over-the-counter twice daily to see if this helps with her discomfort.    Thank you for involving me in the care of this patient.      Diem Dicocco K. Izora Ribas MD, [facility] Neurosurgery

## 2022-06-08 NOTE — Telephone Encounter (Signed)
Dr Izora Ribas has ordered an EMG of her left arm. Please send order to Dr Manuella Ghazi at [facility]. Thanks!

## 2022-06-15 ENCOUNTER — Other Ambulatory Visit: Payer: Self-pay | Admitting: Neurosurgery

## 2022-06-15 DIAGNOSIS — M5412 Radiculopathy, cervical region: Secondary | ICD-10-CM

## 2022-06-15 DIAGNOSIS — G545 Neuralgic amyotrophy: Secondary | ICD-10-CM

## 2022-06-15 DIAGNOSIS — R200 Anesthesia of skin: Secondary | ICD-10-CM

## 2022-06-27 ENCOUNTER — Ambulatory Visit
Admission: RE | Admit: 2022-06-27 | Discharge: 2022-06-27 | Disposition: A | Discharge status: Home / Self Care | Payer: BC Managed Care – PPO | Source: Ambulatory Visit | Attending: Neurosurgery | Admitting: Neurosurgery

## 2022-06-27 ENCOUNTER — Other Ambulatory Visit: Payer: Self-pay | Admitting: Neurosurgery

## 2022-06-27 DIAGNOSIS — R200 Anesthesia of skin: Secondary | ICD-10-CM

## 2022-06-27 DIAGNOSIS — G545 Neuralgic amyotrophy: Secondary | ICD-10-CM

## 2022-06-27 DIAGNOSIS — M5412 Radiculopathy, cervical region: Secondary | ICD-10-CM

## 2022-06-27 MED ORDER — METHYLPREDNISOLONE 4 MG PO TBPK: ORAL_TABLET | ORAL | 0 refills | Status: DC

## 2022-07-05 NOTE — Telephone Encounter (Signed)
EMG scheduled for 07/17/22

## 2022-07-12 NOTE — Telephone Encounter (Signed)
It appears her EMG appt has been canceled.

## 2022-07-16 ENCOUNTER — Other Ambulatory Visit: Payer: Self-pay | Admitting: Neurosurgery

## 2022-07-16 DIAGNOSIS — M5412 Radiculopathy, cervical region: Secondary | ICD-10-CM

## 2022-08-23 ENCOUNTER — Encounter: Payer: Self-pay | Admitting: Neurosurgery

## 2022-08-28 ENCOUNTER — Ambulatory Visit (INDEPENDENT_AMBULATORY_CARE_PROVIDER_SITE_OTHER): Payer: BC Managed Care – PPO | Admitting: Neurosurgery

## 2022-08-28 ENCOUNTER — Encounter: Payer: Self-pay | Admitting: Neurosurgery

## 2022-08-28 VITALS — BP 120/78 | Ht 60.0 in | Wt 133.0 lb

## 2022-08-28 DIAGNOSIS — M5412 Radiculopathy, cervical region: Secondary | ICD-10-CM | POA: Diagnosis not present

## 2022-08-28 NOTE — Patient Instructions (Signed)
Please see below for information in regards to your upcoming surgery:   Planned surgery: C6-7 arthroplasty   Surgery date: 09/14/22 - you will find out your arrival time the business day before your surgery.   Pre-op appointment at [facility]: we will call you with a date/time for this. Pre-admit testing is located on the first floor of the [facility], [place], [address]. Please bring all prescriptions in the original prescription bottles to your appointment, even if you have reviewed medications by phone with a pharmacy representative. Pre-op labs may be done at your pre-op appointment. You are not required to fast for these labs. Should you need to change your pre-op appointment, please call Pre-admit testing at [phone].    Because you are having an arthroplasty: for appointments after your 2 week follow-up: please arrive at the [facility] outpatient imaging center ([place], [place]) or [facility] one hour prior to your appointment for x-rays. This applies to every appointment after your 2 week follow-up. Failure to do so may result in your appointment being rescheduled.   If you have FMLA/disability paperwork, please drop it off or fax it to [phone], attention [place].   We can be reached by phone or mychart 8am-4pm, Monday-Friday. If you have any questions/concerns before or after surgery, you can reach [place] at [phone], or you can send a mychart message. If you have a concern after hours that cannot wait until normal business hours, you can call [phone] and ask to page the neurosurgeon on call for [place].    Appointments/FMLA & disability paperwork: [place]  Nurse: Ophelia Shoulder  Medical assistants: Lum Keas Physician Assistant's: [place] Surgeon: Meade Maw, MD

## 2022-08-28 NOTE — H&P (View-Only) (Signed)
  Referring Physician:  No referring provider defined for this encounter.  Primary Physician:  Klein, Bert J III, MD  History of Present Illness: 08/28/2022 Ms. Edmondson returns to see me.  Since I last saw her, she has been having continued symptoms down her left arm.  It is particularly bad with pain into her shoulder blade.  She has numbness into her left forearm and second through fourth fingers.  She has particular discomfort when she moves her neck.  She has been doing physical therapy without substantial improvement.  She also has noted that her grip strength in her left grip has worsened over time.  This has been objectively tested on her physical therapy evaluation and has shown decreased grip strength.  06/08/2022 Ms. Charly Masley is here today with a chief complaint of left arm numbness.  Over the past 3 to 4 weeks, she has had varying symptoms that started with severe pain in her left arm and is now switched over to numbness.  She has numbness that occurs intermittently.  It was previously more longstanding and happening less often now.  She is now having numbness starting in her left shoulder down to her first and second digit that sometimes goes to all 5 fingers of her hand and occurs very quickly but then stops very quickly prickly when she shakes out her arm.  Straightening her arm also helps.  Bending her arm and sleeping in certain positions makes it worse.  She has had some weakness and has dropped her coffee mug, but attributes this more to not being able to feel the cup in her hand.  She does report some pain into her left shoulder blade.  Left arm weakness and numbness Numbness in left arm to her index and thumb Weakness started last week, but only happens during the episodes of numbness   Bowel/Bladder Dysfunction: none  Conservative measures:  Physical therapy:  has not participated Multimodal medical therapy including regular antiinflammatories: none  Injections:   has not received epidural steroid injections  Past Surgery: denies  Aggie S Commons has no symptoms of cervical myelopathy.  The symptoms are causing a significant impact on the patient's life.   I have utilized the care everywhere function in epic to review the outside records available from external health systems.  Review of Systems:  A 10 point review of systems is negative, except for the pertinent positives and negatives detailed in the HPI.  Past Medical History: Past Medical History:  Diagnosis Date   Alopecia    GERD (gastroesophageal reflux disease)     Past Surgical History:   Allergies: Allergies as of 08/28/2022 - Review Complete 08/28/2022  Allergen Reaction Noted   Gadobenate Nausea And Vomiting and Nausea Only 04/27/2013   Penicillins Anaphylaxis, Other (See Comments), and Shortness Of Breath 12/07/2014    Medications: Current Meds  Medication Sig   cetirizine (ZYRTEC) 10 MG tablet Take 10 mg by mouth daily as needed for allergies.   famotidine (PEPCID) 10 MG tablet Take 10 mg by mouth daily.   spironolactone (ALDACTONE) 50 MG tablet Take 50 mg by mouth 2 (two) times daily.    Social History: Social History   Tobacco Use   Smoking status: Never   Smokeless tobacco: Never  Vaping Use   Vaping Use: Never used    Family Medical History: Family History  Problem Relation Age of Onset   Heart disease Father 50   Heart disease Paternal Grandmother    Heart disease   Paternal Uncle    Cancer Mother        breast    Physical Examination: Vitals:   08/28/22 0947  BP: 120/78    General: Patient is well developed, well nourished, calm, collected, and in no apparent distress. Attention to examination is appropriate.  Neck:   Supple.  Full range of motion with discomfort on rotation to the left, rotation to the R, and extension.  Respiratory: Patient is breathing without any difficulty.   NEUROLOGICAL:     Awake, alert, oriented to person, place,  and time.  Speech is clear and fluent.   Cranial Nerves: Pupils equal round and reactive to light.  Facial tone is symmetric.  Facial sensation is symmetric. Shoulder shrug is symmetric. Tongue protrusion is midline.  There is no pronator drift.  ROM of spine: full.    Strength: Side Biceps Triceps Deltoid Interossei Grip Wrist Ext. Wrist Flex.  R 5 5 5 5 5 5 5  L 5 4+ 5 5 4+ 5 5   Side Iliopsoas Quads Hamstring PF DF EHL  R 5 5 5 5 5 5  L 5 5 5 5 5 5   Reflexes are 1+ and symmetric at the biceps, triceps, brachioradialis, patella and achilles.   Hoffman's is absent.   Bilateral lower extremity sensation is intact to light touch .   She has diminished light touch sensation throughout her left upper extremity in the C7 distribution. No evidence of dysmetria noted.  Gait is normal.     Medical Decision Making  Imaging: MRI C spine 06/27/2022 IMPRESSION: 1. Prominent left foraminal disc protrusion at C6-C7 with impingement of the exiting left C7 nerve root. 2. Mild spondylosis elsewhere in the cervical spine as described above.     Electronically Signed   By: William T Derry M.D.   On: 06/27/2022 11:37  I have personally reviewed the images and agree with the above interpretation.  Assessment and Plan: Ms. Turk is a pleasant 46 y.o. female with left arm numbness that started with severe pain.  Her symptoms follow a low C7 distribution.  She has tried conservative management including physical therapy for more than 6 weeks as well as NSAIDs.  She now shows objective signs of weakness in the left C7 distribution.  Her MRI shows significant impingement of her left C7 nerve root.  At this point, no further conservative management is indicated.  I recommended surgical intervention.  Given her preserved disc height, lack of kyphosis, and lack of evidence for cervical instability, I recommended C6-7 arthroplasty.    I discussed the planned procedure at length with the patient,  including the risks, benefits, alternatives, and indications. The risks discussed include but are not limited to bleeding, infection, need for reoperation, spinal fluid leak, stroke, vision loss, anesthetic complication, coma, paralysis, and even death. I also described in detail that improvement was not guaranteed.  The patient expressed understanding of these risks, and asked that we proceed with surgery. I described the surgery in layman's terms, and gave ample opportunity for questions, which were answered to the best of my ability.      Ludwika Rodd K. Jessicaann Overbaugh MD, MPHS Neurosurgery 

## 2022-08-28 NOTE — Progress Notes (Signed)
Referring Physician:  No referring provider defined for this encounter.  Primary Physician:  Adin Hector, MD  History of Present Illness: 08/28/2022 [name] returns to see me.  Since I last saw her, she has been having continued symptoms down her left arm.  It is particularly bad with pain into her shoulder blade.  She has numbness into her left forearm and second through fourth fingers.  She has particular discomfort when she moves her neck.  She has been doing physical therapy without substantial improvement.  She also has noted that her grip strength in her left grip has worsened over time.  This has been objectively tested on her physical therapy evaluation and has shown decreased grip strength.  06/08/2022 [name] is here today with a chief complaint of left arm numbness.  Over the past 3 to 4 weeks, she has had varying symptoms that started with severe pain in her left arm and is now switched over to numbness.  She has numbness that occurs intermittently.  It was previously more longstanding and happening less often now.  She is now having numbness starting in her left shoulder down to her first and second digit that sometimes goes to all 5 fingers of her hand and occurs very quickly but then stops very quickly prickly when she shakes out her arm.  Straightening her arm also helps.  Bending her arm and sleeping in certain positions makes it worse.  She has had some weakness and has dropped her coffee mug, but attributes this more to not being able to feel the cup in her hand.  She does report some pain into her left shoulder blade.  Left arm weakness and numbness Numbness in left arm to her index and thumb Weakness started last week, but only happens during the episodes of numbness   Bowel/Bladder Dysfunction: none  Conservative measures:  Physical therapy:  has not participated Multimodal medical therapy including regular antiinflammatories: none  Injections:   has not received epidural steroid injections  Past Surgery: denies  [name] has no symptoms of cervical myelopathy.  The symptoms are causing a significant impact on the patient's life.   I have utilized the care everywhere function in epic to review the outside records available from external health systems.  Review of Systems:  A 10 point review of systems is negative, except for the pertinent positives and negatives detailed in the HPI.  Past Medical History: Past Medical History:  Diagnosis Date   Alopecia    GERD (gastroesophageal reflux disease)     Past Surgical History:   Allergies: Allergies as of 08/28/2022 - Review Complete 08/28/2022  Allergen Reaction Noted   Gadobenate Nausea And Vomiting and Nausea Only 04/27/2013   Penicillins Anaphylaxis, Other (See Comments), and Shortness Of Breath 12/07/2014    Medications: Current Meds  Medication Sig   cetirizine (ZYRTEC) 10 MG tablet Take 10 mg by mouth daily as needed for allergies.   famotidine (PEPCID) 10 MG tablet Take 10 mg by mouth daily.   spironolactone (ALDACTONE) 50 MG tablet Take 50 mg by mouth 2 (two) times daily.    Social History: Social History   Tobacco Use   Smoking status: Never   Smokeless tobacco: Never  Vaping Use   Vaping Use: Never used    Family Medical History: Family History  Problem Relation Age of Onset   Heart disease Father 2   Heart disease Paternal Grandmother    Heart disease  Paternal Uncle    Cancer Mother        breast    Physical Examination: Vitals:   08/28/22 0947  BP: 120/78    General: Patient is well developed, well nourished, calm, collected, and in no apparent distress. Attention to examination is appropriate.  Neck:   Supple.  Full range of motion with discomfort on rotation to the left, rotation to the R, and extension.  Respiratory: Patient is breathing without any difficulty.   NEUROLOGICAL:     Awake, alert, oriented to person, place,  and time.  Speech is clear and fluent.   Cranial Nerves: Pupils equal round and reactive to light.  Facial tone is symmetric.  Facial sensation is symmetric. Shoulder shrug is symmetric. Tongue protrusion is midline.  There is no pronator drift.  ROM of spine: full.    Strength: Side Biceps Triceps Deltoid Interossei Grip Wrist Ext. Wrist Flex.  R 5 5 5 5 5 5 5   L 5 4+ 5 5 4+ 5 5   Side Iliopsoas Quads Hamstring PF DF EHL  R 5 5 5 5 5 5   L 5 5 5 5 5 5    Reflexes are 1+ and symmetric at the biceps, triceps, brachioradialis, patella and achilles.   Hoffman's is absent.   Bilateral lower extremity sensation is intact to light touch .   She has diminished light touch sensation throughout her left upper extremity in the C7 distribution. No evidence of dysmetria noted.  Gait is normal.     Medical Decision Making  Imaging: MRI C spine 06/27/2022 IMPRESSION: 1. Prominent left foraminal disc protrusion at C6-C7 with impingement of the exiting left C7 nerve root. 2. Mild spondylosis elsewhere in the cervical spine as described above.     Electronically Signed   By: Titus Dubin M.D.   On: 06/27/2022 11:37  I have personally reviewed the images and agree with the above interpretation.  Assessment and Plan: [name] is a pleasant 47 y.o. female with left arm numbness that started with severe pain.  Her symptoms follow a low C7 distribution.  She has tried conservative management including physical therapy for more than 6 weeks as well as NSAIDs.  She now shows objective signs of weakness in the left C7 distribution.  Her MRI shows significant impingement of her left C7 nerve root.  At this point, no further conservative management is indicated.  I recommended surgical intervention.  Given her preserved disc height, lack of kyphosis, and lack of evidence for cervical instability, I recommended C6-7 arthroplasty.    I discussed the planned procedure at length with the patient,  including the risks, benefits, alternatives, and indications. The risks discussed include but are not limited to bleeding, infection, need for reoperation, spinal fluid leak, stroke, vision loss, anesthetic complication, coma, paralysis, and even death. I also described in detail that improvement was not guaranteed.  The patient expressed understanding of these risks, and asked that we proceed with surgery. I described the surgery in layman's terms, and gave ample opportunity for questions, which were answered to the best of my ability.      Prudencio Velazco K. Izora Ribas MD, [facility] Neurosurgery

## 2022-08-29 ENCOUNTER — Other Ambulatory Visit: Payer: Self-pay

## 2022-08-29 DIAGNOSIS — Z01818 Encounter for other preprocedural examination: Secondary | ICD-10-CM

## 2022-09-03 ENCOUNTER — Inpatient Hospital Stay: Admission: RE | Admit: 2022-09-03 | Payer: BC Managed Care – PPO | Source: Ambulatory Visit

## 2022-09-06 ENCOUNTER — Encounter: Payer: Self-pay | Admitting: Neurosurgery

## 2022-09-06 ENCOUNTER — Encounter
Admission: RE | Admit: 2022-09-06 | Discharge: 2022-09-06 | Disposition: A | Discharge status: Home / Self Care | Payer: BC Managed Care – PPO | Source: Ambulatory Visit | Attending: Neurosurgery | Admitting: Neurosurgery

## 2022-09-06 VITALS — BP 102/66 | HR 72 | Resp 12 | Ht 60.0 in | Wt 132.3 lb

## 2022-09-06 DIAGNOSIS — Z8249 Family history of ischemic heart disease and other diseases of the circulatory system: Secondary | ICD-10-CM | POA: Diagnosis not present

## 2022-09-06 DIAGNOSIS — Z01818 Encounter for other preprocedural examination: Secondary | ICD-10-CM

## 2022-09-06 DIAGNOSIS — M5010 Cervical disc disorder with radiculopathy, unspecified cervical region: Secondary | ICD-10-CM

## 2022-09-06 DIAGNOSIS — Z01812 Encounter for preprocedural laboratory examination: Secondary | ICD-10-CM | POA: Diagnosis present

## 2022-09-06 HISTORY — DX: Generalized hyperhidrosis: R61

## 2022-09-06 HISTORY — DX: Zoster without complications: B02.9

## 2022-09-06 HISTORY — DX: Cervical disc disorder with radiculopathy, unspecified cervical region: M50.10

## 2022-09-06 HISTORY — DX: Family history of malignant neoplasm of breast: Z80.3

## 2022-09-06 HISTORY — DX: Other complications of anesthesia, initial encounter: T88.59XA

## 2022-09-06 HISTORY — DX: Other specified postprocedural states: Z98.890

## 2022-09-06 HISTORY — DX: Other specified postprocedural states: R11.2

## 2022-09-06 HISTORY — DX: Unspecified dyspareunia: N94.10

## 2022-09-06 LAB — CBC
HCT: 43.4 % (ref 36.0–46.0)
Hemoglobin: 15.1 g/dL — ABNORMAL HIGH (ref 12.0–15.0)
MCH: 32.8 pg (ref 26.0–34.0)
MCHC: 34.8 g/dL (ref 30.0–36.0)
MCV: 94.3 fL (ref 80.0–100.0)
Platelets: UNDETERMINED 10*3/uL (ref 150–400)
RBC: 4.6 MIL/uL (ref 3.87–5.11)
RDW: 12.6 % (ref 11.5–15.5)
WBC: 5.7 10*3/uL (ref 4.0–10.5)
nRBC: 0 % (ref 0.0–0.2)

## 2022-09-06 LAB — BASIC METABOLIC PANEL
Anion gap: 10 (ref 5–15)
BUN: 9 mg/dL (ref 6–20)
CO2: 24 mmol/L (ref 22–32)
Calcium: 9.3 mg/dL (ref 8.9–10.3)
Chloride: 103 mmol/L (ref 98–111)
Creatinine, Ser: 0.81 mg/dL (ref 0.44–1.00)
GFR, Estimated: >60 mL/min (ref >60)
Glucose, Bld: 104 mg/dL — ABNORMAL HIGH (ref 70–99)
Potassium: 4.4 mmol/L (ref 3.5–5.1)
Sodium: 137 mmol/L (ref 135–145)

## 2022-09-06 LAB — SURGICAL PCR SCREEN
MRSA, PCR: NEGATIVE
Staphylococcus aureus: NEGATIVE

## 2022-09-06 LAB — TYPE AND SCREEN
ABO/RH(D): A POS
Antibody Screen: NEGATIVE

## 2022-09-06 NOTE — Patient Instructions (Addendum)
Your procedure is scheduled on:09-14-22 Friday Report to the Registration Desk on the 1st floor of the [facility].Then proceed to the 2nd floor Surgery Desk To find out your arrival time, please call [phone] between 1PM - 3PM on:09-13-22 Thursday If your arrival time is 6:00 am, do not arrive before that time as the [facility] entrance doors do not open until 6:00 am.  REMEMBER: Instructions that are not followed completely may result in serious medical risk, up to and including death; or upon the discretion of your surgeon and anesthesiologist your surgery may need to be rescheduled.  Do not eat food after midnight the night before surgery.  No gum chewing or hard candies.  You may however, drink CLEAR liquids up to 2 hours before you are scheduled to arrive for your surgery. Do not drink anything within 2 hours of your scheduled arrival time.  Clear liquids include: - water  - apple juice without pulp - gatorade (not RED colors) - black coffee or tea (Do NOT add milk or creamers to the coffee or tea) Do NOT drink anything that is not on this list  One week prior to surgery:Last dose will be on 09-06-22) Stop Anti-inflammatories (NSAIDS) such as Advil, Aleve, Ibuprofen, Motrin, Naproxen, Naprosyn and Aspirin based products such as Excedrin, Goody's Powder, BC Powder.You may however, take Tylenol if needed for pain up until the day of surgery.  Stop ANY OVER THE COUNTER supplements/vitamins NOW (09-06-22) until after surgery.  TAKE ONLY THESE MEDICATIONS THE MORNING OF SURGERY WITH A SIP OF WATER: -cetirizine (ZYRTEC)  -famotidine (PEPCID)-take one the night before and one on the morning of surgery - helps to prevent nausea after surgery.)  No Alcohol for 24 hours before or after surgery.  No Smoking including e-cigarettes for 24 hours before surgery.  No chewable tobacco products for at least 6 hours before surgery.  No nicotine patches on the day of surgery.  Do not use any  "recreational" drugs for at least a week (preferably 2 weeks) before your surgery.  Please be advised that the combination of cocaine and anesthesia may have negative outcomes, up to and including death. If you test positive for cocaine, your surgery will be cancelled.  On the morning of surgery brush your teeth with toothpaste and water, you may rinse your mouth with mouthwash if you wish. Do not swallow any toothpaste or mouthwash.  Use CHG Soap as directed on instruction sheet.  Do not wear jewelry, make-up, hairpins, clips or nail polish.  Do not wear lotions, powders, or perfumes.   Do not shave body hair from the neck down 48 hours before surgery.  Contact lenses, hearing aids and dentures may not be worn into surgery.  Do not bring valuables to the hospital. [facility] is not responsible for any missing/lost belongings or valuables.   Notify your doctor if there is any change in your medical condition (cold, fever, infection).  Wear comfortable clothing (specific to your surgery type) to the hospital.  After surgery, you can help prevent lung complications by doing breathing exercises.  Take deep breaths and cough every 1-2 hours. Your doctor may order a device called an Incentive Spirometer to help you take deep breaths. When coughing or sneezing, hold a pillow firmly against your incision with both hands. This is called "splinting." Doing this helps protect your incision. It also decreases belly discomfort.  If you are being admitted to the hospital overnight, leave your suitcase in the car. After  surgery it may be brought to your room.  In case of increased patient census, it may be necessary for you, the patient, to continue your postoperative care in the Same Day Surgery department.  If you are being discharged the day of surgery, you will not be allowed to drive home. You will need a responsible individual to drive you home and stay with you for 24 hours after surgery.    If you are taking public transportation, you will need to have a responsible individual with you.  Please call the [facility] Dept. at [phone] if you have any questions about these instructions.  Surgery Visitation Policy:  Patients having surgery or a procedure may have two visitors.  Children under the age of 43 must have an adult with them who is not the patient.

## 2022-09-14 ENCOUNTER — Ambulatory Visit: Payer: BC Managed Care – PPO | Admitting: Certified Registered Nurse Anesthetist

## 2022-09-14 ENCOUNTER — Ambulatory Visit
Admission: RE | Admit: 2022-09-14 | Discharge: 2022-09-14 | Disposition: A | Discharge status: Home / Self Care | Payer: BC Managed Care – PPO | Attending: Neurosurgery | Admitting: Neurosurgery

## 2022-09-14 ENCOUNTER — Other Ambulatory Visit: Payer: Self-pay

## 2022-09-14 ENCOUNTER — Encounter
Admission: RE | Disposition: A | Discharge status: Home / Self Care | Payer: Self-pay | Source: Home / Self Care | Attending: Neurosurgery

## 2022-09-14 ENCOUNTER — Encounter: Payer: Self-pay | Admitting: Neurosurgery

## 2022-09-14 ENCOUNTER — Ambulatory Visit: Payer: BC Managed Care – PPO | Admitting: Urgent Care

## 2022-09-14 ENCOUNTER — Ambulatory Visit: Payer: BC Managed Care – PPO

## 2022-09-14 DIAGNOSIS — M5412 Radiculopathy, cervical region: Secondary | ICD-10-CM

## 2022-09-14 DIAGNOSIS — M4722 Other spondylosis with radiculopathy, cervical region: Secondary | ICD-10-CM | POA: Insufficient documentation

## 2022-09-14 DIAGNOSIS — R29898 Other symptoms and signs involving the musculoskeletal system: Secondary | ICD-10-CM | POA: Diagnosis not present

## 2022-09-14 DIAGNOSIS — M50123 Cervical disc disorder at C6-C7 level with radiculopathy: Secondary | ICD-10-CM | POA: Insufficient documentation

## 2022-09-14 DIAGNOSIS — Z01818 Encounter for other preprocedural examination: Secondary | ICD-10-CM

## 2022-09-14 DIAGNOSIS — K219 Gastro-esophageal reflux disease without esophagitis: Secondary | ICD-10-CM | POA: Insufficient documentation

## 2022-09-14 HISTORY — PX: CERVICAL DISC ARTHROPLASTY: SHX587

## 2022-09-14 LAB — ABO/RH: ABO/RH(D): A POS

## 2022-09-14 SURGERY — CERVICAL ANTERIOR DISC ARTHROPLASTY
Anesthesia: General | Site: Spine Cervical

## 2022-09-14 MED ORDER — SENNA 8.6 MG PO TABS: 1.0000 | ORAL_TABLET | Freq: Every day | ORAL | 0 refills | Status: DC | PRN

## 2022-09-14 MED ORDER — 0.9 % SODIUM CHLORIDE (POUR BTL) OPTIME
TOPICAL | Status: DC | PRN
  Administered 2022-09-14: 500 mL

## 2022-09-14 MED ORDER — MIDAZOLAM HCL 2 MG/2ML IJ SOLN
INTRAMUSCULAR | Status: AC
  Filled 2022-09-14: qty 2

## 2022-09-14 MED ORDER — BUPIVACAINE-EPINEPHRINE (PF) 0.5% -1:200000 IJ SOLN
INTRAMUSCULAR | Status: DC | PRN
  Administered 2022-09-14: 4 mL via PERINEURAL

## 2022-09-14 MED ORDER — METHOCARBAMOL 500 MG PO TABS: 500.0000 mg | ORAL_TABLET | Freq: Four times a day (QID) | ORAL | 0 refills | Status: DC

## 2022-09-14 MED ORDER — PROPOFOL 10 MG/ML IV BOLUS
INTRAVENOUS | Status: AC
  Filled 2022-09-14: qty 20

## 2022-09-14 MED ORDER — SURGIFLO WITH THROMBIN (HEMOSTATIC MATRIX KIT) OPTIME
TOPICAL | Status: DC | PRN
  Administered 2022-09-14: 1 via TOPICAL

## 2022-09-14 MED ORDER — FENTANYL CITRATE (PF) 100 MCG/2ML IJ SOLN: 25.0000 ug | INTRAMUSCULAR | Status: DC | PRN

## 2022-09-14 MED ORDER — ONDANSETRON HCL 4 MG/2ML IJ SOLN: 4.0000 mg | Freq: Once | INTRAMUSCULAR | Status: DC | PRN

## 2022-09-14 MED ORDER — ONDANSETRON HCL 4 MG/2ML IJ SOLN
INTRAMUSCULAR | Status: DC | PRN
  Administered 2022-09-14 (×2): 4 mg via INTRAVENOUS

## 2022-09-14 MED ORDER — CEFAZOLIN IN SODIUM CHLORIDE 2-0.9 GM/100ML-% IV SOLN
2.0000 g | Freq: Once | INTRAVENOUS | Status: DC
  Filled 2022-09-14: qty 100

## 2022-09-14 MED ORDER — PHENYLEPHRINE 80 MCG/ML (10ML) SYRINGE FOR IV PUSH (FOR BLOOD PRESSURE SUPPORT)
PREFILLED_SYRINGE | INTRAVENOUS | Status: DC | PRN
  Administered 2022-09-14 (×3): 80 ug via INTRAVENOUS

## 2022-09-14 MED ORDER — NAPROXEN 500 MG PO TABS: 500.0000 mg | ORAL_TABLET | Freq: Two times a day (BID) | ORAL | 0 refills | Status: DC

## 2022-09-14 MED ORDER — LACTATED RINGERS IV SOLN: INTRAVENOUS | Status: DC

## 2022-09-14 MED ORDER — ORAL CARE MOUTH RINSE: 15.0000 mL | Freq: Once | OROMUCOSAL | Status: AC

## 2022-09-14 MED ORDER — BUPIVACAINE-EPINEPHRINE (PF) 0.5% -1:200000 IJ SOLN
INTRAMUSCULAR | Status: AC
  Filled 2022-09-14: qty 30

## 2022-09-14 MED ORDER — FENTANYL CITRATE (PF) 100 MCG/2ML IJ SOLN
INTRAMUSCULAR | Status: AC
  Filled 2022-09-14: qty 2

## 2022-09-14 MED ORDER — CEFAZOLIN SODIUM-DEXTROSE 2-4 GM/100ML-% IV SOLN
2.0000 g | INTRAVENOUS | Status: AC
  Administered 2022-09-14: 2 g via INTRAVENOUS

## 2022-09-14 MED ORDER — FENTANYL CITRATE (PF) 100 MCG/2ML IJ SOLN
INTRAMUSCULAR | Status: DC | PRN
  Administered 2022-09-14 (×2): 50 ug via INTRAVENOUS

## 2022-09-14 MED ORDER — ACETAMINOPHEN 10 MG/ML IV SOLN
INTRAVENOUS | Status: AC
  Filled 2022-09-14: qty 100

## 2022-09-14 MED ORDER — PROPOFOL 10 MG/ML IV BOLUS
INTRAVENOUS | Status: DC | PRN
  Administered 2022-09-14: 50 mg via INTRAVENOUS
  Administered 2022-09-14: 150 mg via INTRAVENOUS

## 2022-09-14 MED ORDER — SUCCINYLCHOLINE CHLORIDE 200 MG/10ML IV SOSY
PREFILLED_SYRINGE | INTRAVENOUS | Status: DC | PRN
  Administered 2022-09-14: 100 mg via INTRAVENOUS

## 2022-09-14 MED ORDER — DEXAMETHASONE SODIUM PHOSPHATE 10 MG/ML IJ SOLN
INTRAMUSCULAR | Status: DC | PRN
  Administered 2022-09-14: 10 mg via INTRAVENOUS

## 2022-09-14 MED ORDER — HYDROMORPHONE HCL 1 MG/ML IJ SOLN
INTRAMUSCULAR | Status: AC
  Filled 2022-09-14: qty 1

## 2022-09-14 MED ORDER — CEFAZOLIN SODIUM-DEXTROSE 2-4 GM/100ML-% IV SOLN
INTRAVENOUS | Status: AC
  Filled 2022-09-14: qty 100

## 2022-09-14 MED ORDER — IBUPROFEN 600 MG PO TABS
600.0000 mg | ORAL_TABLET | Freq: Once | ORAL | Status: AC
  Administered 2022-09-14: 600 mg via ORAL
  Filled 2022-09-14: qty 1

## 2022-09-14 MED ORDER — ACETAMINOPHEN 10 MG/ML IV SOLN
INTRAVENOUS | Status: DC | PRN
  Administered 2022-09-14: 1000 mg via INTRAVENOUS

## 2022-09-14 MED ORDER — GLYCOPYRROLATE 0.2 MG/ML IJ SOLN
INTRAMUSCULAR | Status: DC | PRN
  Administered 2022-09-14: .2 mg via INTRAVENOUS

## 2022-09-14 MED ORDER — OXYCODONE HCL 5 MG PO TABS: 5.0000 mg | ORAL_TABLET | ORAL | 0 refills | Status: AC | PRN

## 2022-09-14 MED ORDER — HYDROMORPHONE HCL 1 MG/ML IJ SOLN
INTRAMUSCULAR | Status: DC | PRN
  Administered 2022-09-14 (×2): .5 mg via INTRAVENOUS

## 2022-09-14 MED ORDER — CHLORHEXIDINE GLUCONATE 0.12 % MT SOLN
OROMUCOSAL | Status: AC
  Filled 2022-09-14: qty 15

## 2022-09-14 MED ORDER — CHLORHEXIDINE GLUCONATE 0.12 % MT SOLN
15.0000 mL | Freq: Once | OROMUCOSAL | Status: AC
  Administered 2022-09-14: 15 mL via OROMUCOSAL

## 2022-09-14 MED ORDER — LIDOCAINE HCL (CARDIAC) PF 100 MG/5ML IV SOSY
PREFILLED_SYRINGE | INTRAVENOUS | Status: DC | PRN
  Administered 2022-09-14: 80 mg via INTRAVENOUS

## 2022-09-14 MED ORDER — IBUPROFEN 600 MG PO TABS
ORAL_TABLET | ORAL | Status: AC
  Filled 2022-09-14: qty 1

## 2022-09-14 MED ORDER — MIDAZOLAM HCL 2 MG/2ML IJ SOLN
INTRAMUSCULAR | Status: DC | PRN
  Administered 2022-09-14: 2 mg via INTRAVENOUS

## 2022-09-14 MED ORDER — PHENYLEPHRINE HCL (PRESSORS) 10 MG/ML IV SOLN
INTRAVENOUS | Status: AC
  Filled 2022-09-14: qty 1

## 2022-09-14 SURGICAL SUPPLY — 59 items
ADH SKN CLS APL DERMABOND .7 (GAUZE/BANDAGES/DRESSINGS) ×1
AGENT HMST KT MTR STRL THRMB (HEMOSTASIS) ×1
APL PRP STRL LF DISP 70% ISPRP (MISCELLANEOUS) ×2
BLADE SURG 15 STRL LF DISP TIS (BLADE) IMPLANT
BLADE SURG 15 STRL SS (BLADE) ×1
BUR NEURO DRILL SOFT 3.0X3.8M (BURR) ×1 IMPLANT
CHLORAPREP W/TINT 26 (MISCELLANEOUS) ×2 IMPLANT
COUNTER NEEDLE 20/40 LG (NEEDLE) ×1 IMPLANT
DERMABOND ADVANCED .7 DNX12 (GAUZE/BANDAGES/DRESSINGS) ×1 IMPLANT
DISC MOBI-C CERVICAL 13X17 H5 (Miscellaneous) IMPLANT
DRAPE C ARM PK CFD 31 SPINE (DRAPES) ×1 IMPLANT
DRAPE LAPAROTOMY 77X122 PED (DRAPES) ×1 IMPLANT
DRAPE MICROSCOPE SPINE 48X150 (DRAPES) ×1 IMPLANT
DRAPE SURG 17X11 SM STRL (DRAPES) ×1 IMPLANT
DRSG OPSITE POSTOP 4X6 (GAUZE/BANDAGES/DRESSINGS) ×2 IMPLANT
ELECT CAUTERY BLADE TIP 2.5 (TIP) ×1
ELECT REM PT RETURN 9FT ADLT (ELECTROSURGICAL) ×1
ELECTRODE CAUTERY BLDE TIP 2.5 (TIP) ×1 IMPLANT
ELECTRODE REM PT RTRN 9FT ADLT (ELECTROSURGICAL) ×1 IMPLANT
FEE INTRAOP CADWELL SUPPLY NCS (MISCELLANEOUS) IMPLANT
FEE INTRAOP MONITOR IMPULS NCS (MISCELLANEOUS) IMPLANT
GLOVE BIOGEL PI IND STRL 6.5 (GLOVE) ×1 IMPLANT
GLOVE BIOGEL PI IND STRL 8.5 (GLOVE) ×2 IMPLANT
GLOVE SURG SYN 6.5 ES PF (GLOVE) ×1 IMPLANT
GLOVE SURG SYN 6.5 PF PI (GLOVE) ×1 IMPLANT
GLOVE SURG SYN 8.5  E (GLOVE) ×3
GLOVE SURG SYN 8.5 E (GLOVE) ×3 IMPLANT
GLOVE SURG SYN 8.5 PF PI (GLOVE) ×3 IMPLANT
GOWN SRG LRG LVL 4 IMPRV REINF (GOWNS) ×1 IMPLANT
GOWN SRG XL LVL 3 NONREINFORCE (GOWNS) ×1 IMPLANT
GOWN STRL NON-REIN TWL XL LVL3 (GOWNS) ×1
GOWN STRL REIN LRG LVL4 (GOWNS) ×1
GRADUATE 1200CC STRL 31836 (MISCELLANEOUS) ×1 IMPLANT
INTRAOP CADWELL SUPPLY FEE NCS (MISCELLANEOUS)
INTRAOP DISP SUPPLY FEE NCS (MISCELLANEOUS)
INTRAOP MONITOR FEE IMPULS NCS (MISCELLANEOUS)
INTRAOP MONITOR FEE IMPULSE (MISCELLANEOUS)
KIT TURNOVER KIT A (KITS) ×1 IMPLANT
MANIFOLD NEPTUNE II (INSTRUMENTS) ×1 IMPLANT
MARKER SKIN DUAL TIP RULER LAB (MISCELLANEOUS) ×2 IMPLANT
NDL SAFETY ECLIP 18X1.5 (MISCELLANEOUS) IMPLANT
NS IRRIG 1000ML POUR BTL (IV SOLUTION) ×1 IMPLANT
NS IRRIG 500ML POUR BTL (IV SOLUTION) IMPLANT
PACK LAMINECTOMY NEURO (CUSTOM PROCEDURE TRAY) ×1 IMPLANT
PAD ARMBOARD 7.5X6 YLW CONV (MISCELLANEOUS) ×1 IMPLANT
PIN CASPAR SPINAL 12MM (PIN) IMPLANT
SOLUTION IRRIG SURGIPHOR (IV SOLUTION) ×1 IMPLANT
SPONGE KITTNER 5P (MISCELLANEOUS) ×1 IMPLANT
STAPLER SKIN PROX 35W (STAPLE) IMPLANT
SURGIFLO W/THROMBIN 8M KIT (HEMOSTASIS) ×1 IMPLANT
SUT DVC VLOC 3-0 CL 6 P-12 (SUTURE) IMPLANT
SUT VIC AB 3-0 SH 8-18 (SUTURE) ×1 IMPLANT
SUT VICRYL 3-0 CR8 SH (SUTURE) ×1 IMPLANT
SYR 30ML LL (SYRINGE) ×1 IMPLANT
TAPE CLOTH 3X10 WHT NS LF (GAUZE/BANDAGES/DRESSINGS) ×1 IMPLANT
TOWEL OR 17X26 4PK STRL BLUE (TOWEL DISPOSABLE) ×3 IMPLANT
TRAP FLUID SMOKE EVACUATOR (MISCELLANEOUS) ×1 IMPLANT
TRAY FOLEY SLVR 16FR LF STAT (SET/KITS/TRAYS/PACK) IMPLANT
TUBING CONNECTING 10 (TUBING) ×1 IMPLANT

## 2022-09-14 NOTE — Anesthesia Postprocedure Evaluation (Signed)
Anesthesia Post Note  Patient: [name]  Procedure(s) Performed: C6-7 ARTHROPLASTY (Spine Cervical)  Patient location during evaluation: PACU Anesthesia Type: General Level of consciousness: awake and oriented Pain management: satisfactory to patient Vital Signs Assessment: post-procedure vital signs reviewed and stable Respiratory status: spontaneous breathing Cardiovascular status: blood pressure returned to baseline Anesthetic complications: no   No notable events documented.   Last Vitals:  Vitals:   09/14/22 1000 09/14/22 1019  BP: 108/82 112/77  Pulse: 93 90  Resp: (!) 26 18  Temp: (!) 36.1 C (!) 36.2 C  SpO2: 100% 100%    Last Pain:  Vitals:   09/14/22 1019  TempSrc: Temporal  PainSc: 4                  VAN STAVEREN,Scharlene Catalina

## 2022-09-14 NOTE — Discharge Summary (Signed)
Discharge Summary  Patient ID: MISK DOROTHY MRN: [id number] DOB/AGE: 1976/06/02 47 y.o.  Admit date: 09/14/2022 Discharge date: 09/14/2022  Admission Diagnoses: M54.12 cervical radiculopathy, R29.898 left arm weakness.   Discharge Diagnoses:  Active Problems:   Cervical radiculopathy   Left arm weakness   Discharged Condition: good  Hospital Course: *** [name] is a 47 y.o presenting with cervical radiculopathy and left arm weakness.  She underwent a C6-7 arthroplasty.  Her intraoperative course was uncomplicated.  She was monitored in PACU for 4 hours and discharged home after ambulating, urinating, and tolerating p.o. intake.  She was given prescriptions for naproxen, senna, oxycodone, and Robaxin.  Consults: None  Significant Diagnostic Studies: none  Treatments: surgery: as above. Please see separately dictated operative report for further details.  Discharge Exam: Blood pressure 109/81, pulse 87, temperature 98.2 F (36.8 C), temperature source Temporal, resp. rate 14, height 5' (1.524 m), weight 60 kg, last menstrual period 09/06/2022, SpO2 98 %. CN II-XII grossly intact MAEW Incision c/d/I with dermabond in place   Disposition: Discharge disposition: 01-Home or Self Care        Allergies as of 09/14/2022       Reactions   Gadobenate Nausea And Vomiting, Nausea Only   Penicillins Anaphylaxis, Other (See Comments), Shortness Of Breath   Other Reaction: shock        Medication List     TAKE these medications    cetirizine 10 MG tablet Commonly known as: ZYRTEC Take 10 mg by mouth every morning.   famotidine 10 MG tablet Commonly known as: PEPCID Take 10 mg by mouth every morning.   methocarbamol 500 MG tablet Commonly known as: ROBAXIN Take 1 tablet (500 mg total) by mouth 4 (four) times daily.   naproxen 500 MG tablet Commonly known as: Naprosyn Take 1 tablet (500 mg total) by mouth 2 (two) times daily with a meal.   oxyCODONE 5 MG  immediate release tablet Commonly known as: Roxicodone Take 1 tablet (5 mg total) by mouth every 4 (four) hours as needed for up to 5 days for severe pain.   senna 8.6 MG Tabs tablet Commonly known as: SENOKOT Take 1 tablet (8.6 mg total) by mouth daily as needed.   spironolactone 50 MG tablet Commonly known as: ALDACTONE Take 50 mg by mouth 2 (two) times daily.        Follow-up Information     Drake Leach, PA-C Follow up on 09/27/2022.   Specialty: Neurosurgery Contact information: [address] [place] [address] [phone]                 Signed: Susanne Borders 09/14/2022, 9:08 AM

## 2022-09-14 NOTE — Anesthesia Procedure Notes (Signed)
Procedure Name: Intubation Date/Time: 09/14/2022 7:27 AM  Performed by: Mohammed Kindle, CRNAPre-anesthesia Checklist: Patient identified, Emergency Drugs available, Suction available and Patient being monitored Patient Re-evaluated:Patient Re-evaluated prior to induction Oxygen Delivery Method: Circle system utilized Preoxygenation: Pre-oxygenation with 100% oxygen Induction Type: IV induction Ventilation: Mask ventilation without difficulty Laryngoscope Size: McGraph and 3 Grade View: Grade I Tube type: Oral Tube size: 6.5 mm Number of attempts: 1 Airway Equipment and Method: Stylet and Oral airway Placement Confirmation: ETT inserted through vocal cords under direct vision, positive ETCO2, breath sounds checked- equal and bilateral and CO2 detector Secured at: 21 cm Tube secured with: Tape Dental Injury: Teeth and Oropharynx as per pre-operative assessment

## 2022-09-14 NOTE — Anesthesia Preprocedure Evaluation (Signed)
Anesthesia Evaluation  Patient identified by MRN, date of birth, ID band Patient awake    Reviewed: Allergy & Precautions, NPO status , Patient's Chart, lab work & pertinent test results  History of Anesthesia Complications (+) PONV and history of anesthetic complications  Airway Mallampati: II  TM Distance: >3 FB Neck ROM: full    Dental  (+) Teeth Intact   Pulmonary neg pulmonary ROS   Pulmonary exam normal breath sounds clear to auscultation       Cardiovascular Exercise Tolerance: Good negative cardio ROS Normal cardiovascular exam Rhythm:Regular Rate:Normal     Neuro/Psych negative neurological ROS  negative psych ROS   GI/Hepatic negative GI ROS, Neg liver ROS,GERD  Medicated,,  Endo/Other  negative endocrine ROS    Renal/GU negative Renal ROS  negative genitourinary   Musculoskeletal   Abdominal Normal abdominal exam  (+)   Peds negative pediatric ROS (+)  Hematology negative hematology ROS (+)   Anesthesia Other Findings Past Medical History: 2005: Abnormal vaginal Pap smear     Comment:  a.) subsequent colposcopy results negative No date: Alopecia No date: Cervical disc disorder with radiculopathy     Comment:  a.) CT 06/27/2022: LEFT foraminal disc protrusion at               C6-C7 with impingement of exiting LEFT C7 nerve root No date: Complication of anesthesia No date: Dyspareunia in female     Comment:  a.) resolved with PT No date: Family history of breast cancer     Comment:  a.) BRCA negative; Claus model 33% lifetime risk No date: GERD (gastroesophageal reflux disease) No date: Hyperhidrosis No date: PONV (postoperative nausea and vomiting) No date: Shingles  Past Surgical History: 01/02/2014: ABDOMINAL SURGERY No date: CESAREAN SECTION No date: pelvic reconstruction surgery  BMI    Body Mass Index: 25.83 kg/m      Reproductive/Obstetrics negative OB ROS                              Anesthesia Physical Anesthesia Plan  ASA: 1  Anesthesia Plan: General   Post-op Pain Management:    Induction: Intravenous  PONV Risk Score and Plan: 1 and Ondansetron and Dexamethasone  Airway Management Planned: Oral ETT  Additional Equipment:   Intra-op Plan:   Post-operative Plan: Extubation in OR  Informed Consent: I have reviewed the patients History and Physical, chart, labs and discussed the procedure including the risks, benefits and alternatives for the proposed anesthesia with the patient or authorized representative who has indicated his/her understanding and acceptance.     Dental Advisory Given  Plan Discussed with: CRNA and Surgeon  Anesthesia Plan Comments:        Anesthesia Quick Evaluation

## 2022-09-14 NOTE — Progress Notes (Signed)
Pt was hable to void, drink fluids and ambulate without difficulties.  VS WNL. Oked per MD Myer Haff for pt go home before the 4 hrs time post op. Continue to monitor.

## 2022-09-14 NOTE — Discharge Instructions (Addendum)
Your surgeon has performed an operation on your cervical spine (neck) to relieve pressure on the spinal cord and/or nerves. This involved making an incision in the front of your neck and removing one or more of the discs that support your spine. Next, a small piece of bone, a titanium plate, and screws were used to fuse two or more of the vertebrae (bones) together.  The following are instructions to help in your recovery once you have been discharged from the hospital. Even if you feel well, it is important that you follow these activity guidelines. If you do not let your neck heal properly from the surgery, you can increase the chance of return of your symptoms and other complications.  * Do not take anti-inflammatory medications for 3 months after surgery (naproxen [Aleve], ibuprofen [Advil, Motrin], etc.). These medications can prevent your bones from healing properly.  Celebrex, if prescribed, is ok to take.  Activity    No bending, lifting, or twisting ("BLT"). Avoid lifting objects heavier than 10 pounds (gallon milk jug).  Where possible, avoid household activities that involve lifting, bending, reaching, pushing, or pulling such as laundry, vacuuming, grocery shopping, and childcare. Try to arrange for help from friends and family for these activities while your back heals.  Increase physical activity slowly as tolerated.  Taking short walks is encouraged, but avoid strenuous exercise. Do not jog, run, bicycle, lift weights, or participate in any other exercises unless specifically allowed by your doctor.  Talk to your doctor before resuming sexual activity.  You should not drive until cleared by your doctor.  Until released by your doctor, you should not return to work or school.  You should rest at home and let your body heal.   You may shower three days after your surgery.  After showering, lightly dab your incision dry. Do not take a tub bath or go swimming until approved by your  doctor at your follow-up appointment.  If your doctor ordered a cervical collar (neck brace) for you, you should wear it whenever you are out of bed. You may remove it when lying down or sleeping, but you should wear it at all other times. Not all neck surgeries require a cervical collar.  If you smoke, we strongly recommend that you quit.  Smoking has been proven to interfere with normal bone healing and will dramatically reduce the success rate of your surgery. Please contact [facility] (800-QUIT-NOW) and use the resources at www.[facility].com for assistance in stopping smoking.  Surgical Incision   If you have a dressing on your incision, you may remove it two days after your surgery. Keep your incision area clean and dry.  If you have staples or stitches on your incision, you should have a follow up scheduled for removal. If you do not have staples or stitches, you will have steri-strips (small pieces of surgical tape) or Dermabond glue. The steri-strips/glue should begin to peel away within about a week (it is fine if the steri-strips fall off before then). If the strips are still in place one week after your surgery, you may gently remove them.  Diet           You may return to your usual diet. However, you may experience discomfort when swallowing in the first month after your surgery. This is normal. You may find that softer foods are more comfortable for you to swallow. Be sure to stay hydrated.  When to Contact Us  You may experience pain in your   neck and/or pain between your shoulder blades. This is normal and should improve in the next few weeks with the help of pain medication, muscle relaxers, and rest. Some patients report that a warm compress on the back of the neck or between the shoulder blades helps.  However, should you experience any of the following, contact us immediately: New numbness or weakness Pain that is progressively getting worse, and is not relieved by your pain  medication, muscle relaxers, rest, and warm compresses Bleeding, redness, swelling, pain, or drainage from surgical incision Chills or flu-like symptoms Fever greater than 101.0 F (38.3 C) Inability to eat, drink fluids, or take medications Problems with bowel or bladder functions Difficulty breathing or shortness of breath Warmth, tenderness, or swelling in your calf Contact Information During office hours (Monday-Friday 9 am to 5 pm), please call your physician at [phone] and ask for Kendelyn Jean After hours and weekends, please call [phone] and speak with the neurosurgeon on call For a life-threatening emergency, call 911    AMBULATORY SURGERY  DISCHARGE INSTRUCTIONS   The drugs that you were given will stay in your system until tomorrow so for the next 24 hours you should not:  Drive an automobile Make any legal decisions Drink any alcoholic beverage   You may resume regular meals tomorrow.  Today it is better to start with liquids and gradually work up to solid foods.  You may eat anything you prefer, but it is better to start with liquids, then soup and crackers, and gradually work up to solid foods.   Please notify your doctor immediately if you have any unusual bleeding, trouble breathing, redness and pain at the surgery site, drainage, fever, or pain not relieved by medication.   Your post-operative visit with Dr.                                       is: Date:                        Time:    Please call to schedule your post-operative visit.  Additional Instructions: 

## 2022-09-14 NOTE — Transfer of Care (Signed)
Immediate Anesthesia Transfer of Care Note  Patient: [name]  Procedure(s) Performed: C6-7 ARTHROPLASTY (Spine Cervical)  Patient Location: PACU  Anesthesia Type:General  Level of Consciousness: awake, drowsy, and patient cooperative  Airway & Oxygen Therapy: Patient Spontanous Breathing and Patient connected to face mask oxygen  Post-op Assessment: Report given to RN and Post -op Vital signs reviewed and stable  Post vital signs: Reviewed and stable  Last Vitals:  Vitals Value Taken Time  BP 104/76 09/14/22 0915  Temp 36.7 C 09/14/22 0915  Pulse 100 09/14/22 0919  Resp 13 09/14/22 0919  SpO2 98 % 09/14/22 0919  Vitals shown include unvalidated device data.  Last Pain:  Vitals:   09/14/22 0915  TempSrc:   PainSc: Asleep         Complications: No notable events documented.

## 2022-09-14 NOTE — Op Note (Signed)
Indications: [name] presented with M54.12 cervical radiculopathy, R29.898 left arm weakness. She failed conservative management prompting surgical intervention.  Findings: disc herniation on L at C6/7  Preoperative Diagnosis: M54.12 cervical radiculopathy, R29.898 left arm weakness  Postoperative Diagnosis: same   EBL: 50 ml IVF: see AR ml Drains: none Disposition: Extubated and Stable to PACU Complications: none  No foley catheter was placed.   Preoperative Note:   Risks of surgery discussed include: infection, bleeding, stroke, coma, death, paralysis, CSF leak, nerve/spinal cord injury, numbness, tingling, weakness, complex regional pain syndrome, recurrent stenosis and/or disc herniation, vascular injury, development of instability, neck/back pain, need for further surgery, persistent symptoms, development of deformity, and the risks of anesthesia. The patient understood these risks and agreed to proceed.  Operative Note:  Procedure:  1) Cervical Disc Arthroplasty at C6/7 using a LDR Mobi-C device   Procedure: After obtaining informed consent, the patient taken to the operating room, placed in supine position, general anesthesia induced.  The patient had a small shoulder roll placed behind the neck.  The patient received preop antibiotics and IV Decadron.  The patient had a neck incision outlined, was prepped and draped in usual sterile fashion. The incision was injected with local anesthetic.   An incision was opened, dissection taken down medial to the carotid artery and jugular vein, lateral to the trachea and esophagus.  The prevertebral fascia identified and a localizing x-ray demonstrated the correct level.  The longus colli were dissected laterally, and self-retaining retractors placed to open the operative field. The microscope was then brought into the field.  With this complete, distractor pins were placed in the vertebral bodies of C6 and C7. The distractor was placed,  and the anulus at C6/7 was opened using a bovie.  Curettes and pituitary rongeurs used to remove the majority of disk, then the drill was used to remove the posterior osteophyte and begin the foraminotomies. The nerve hook was used to elevate the posterior longitudinal ligament, which was then removed with Kerrison rongeurs. The microblunt nerve hook could be passed out the foramen bilaterally.   Meticulous hemostasis was obtained.  A trial spacer was used to size the disc space. Using flouroscopic guidance, a 17 mm width x 13 mm depth x 5 mm height Mobi-C was then inserted in the prepared disc space.  The caspar distractor was removed, and bone wax used for hemostasis. Final AP and lateral radiographs were taken.   With the disc arthroplasty in good position, the wound was irrigated copiously and meticulous hemostasis obtained.  Wound was closed in 2 layers using interrupted inverted 3-0 Vicryl sutures.  The wound was dressed with dermabond, the head of bed at 30 degrees, taken to recovery room in stable condition.  No new postop neurological deficits were identified.  Sponge and pattie counts were correct at the end of the procedure.     I performed the entire procedure with the assistance of Manning Charity PA as an Designer, television/film set. An assistant was required for this procedure due to the complexity.  The assistant provided assistance in tissue manipulation and suction, and was required for the successful and safe performance of the procedure. I performed the critical portions of the procedure.   Venetia Night MD

## 2022-09-14 NOTE — Interval H&P Note (Signed)
History and Physical Interval Note:  09/14/2022 7:05 AM  [name]  has presented today for surgery, with the diagnosis of M54.12 cervical radiculopathy R29.898 left arm weakness.  The various methods of treatment have been discussed with the patient and family. After consideration of risks, benefits and other options for treatment, the patient has consented to  Procedure(s): C6-7 ARTHROPLASTY (N/A) as a surgical intervention.  The patient's history has been reviewed, patient examined, no change in status, stable for surgery.  I have reviewed the patient's chart and labs.  Questions were answered to the patient's satisfaction.    Heart sounds normal no MRG. Chest Clear to Auscultation Bilaterally.   Nicolle Heward

## 2022-09-17 ENCOUNTER — Encounter: Payer: Self-pay | Admitting: Neurosurgery

## 2022-09-18 ENCOUNTER — Encounter: Payer: Self-pay | Admitting: Neurosurgery

## 2022-09-18 DIAGNOSIS — Z9889 Other specified postprocedural states: Secondary | ICD-10-CM

## 2022-09-18 DIAGNOSIS — M5412 Radiculopathy, cervical region: Secondary | ICD-10-CM

## 2022-09-21 MED ORDER — METHYLPREDNISOLONE 4 MG PO TBPK
ORAL_TABLET | ORAL | 0 refills | Status: DC
Start: 2022-09-21 — End: 2022-09-26

## 2022-09-21 NOTE — Telephone Encounter (Signed)
Spoke with patient. Her preop pain has improved and numbness/tingling is gone. She is not having much postop pain at all.   Not taking any narcotics or muscle relaxers. Has been taking motrin.   She removed the dermabond. She has burning and itching around the incision. Skin feels sensitive.   No drainage from incision. I reviewed picture and no signs of infection.   Will call in medrol dose pack to help with symptoms. She has taken previously. She will hold motrin when on dose pack.   Offered her to be seen today and she declines.   She will call with any further questions or concerns.

## 2022-09-25 NOTE — Progress Notes (Deleted)
   REFERRING PHYSICIAN:  Lynnea Ferrier, Md [address] [facility] [place],  [place] [address]  DOS: 09/14/22  C6-C7 arthroplasty  HISTORY OF PRESENT ILLNESS: [name] is 2 weeks status post C6-C7 arthroplasty. Given robaxin and oxycocone on discharge from the hospital.   She called last week with irritation/rash around incision. She removed the dermabond and continue with itching. Medrol dose pack called in. She was doing very well otherwise with minimal pain.      PHYSICAL EXAMINATION:  NEUROLOGICAL:  General: In no acute distress.   Awake, alert, oriented to person, place, and time.  Pupils equal round and reactive to light.  Facial tone is symmetric.    Strength: Side Biceps Triceps Deltoid Interossei Grip Wrist Ext. Wrist Flex.  R L Incision c/d/i  Imaging:  Nothing new to review.   Assessment / Plan: [name] is doing well s/p above surgery. Treatment options reviewed with patient and following plan made:   - We discussed activity escalation and I have advised the patient to lift up to 10 pounds until 6 weeks after surgery (until follow up with Dr. Myer Haff).   - Reviewed wound care.  - Continue current medications including *** - Follow up as scheduled in 4 weeks and prn.   Advised to contact the office if any questions or concerns arise.   Drake Leach PA-C Dept of Neurosurgery

## 2022-09-26 ENCOUNTER — Ambulatory Visit (INDEPENDENT_AMBULATORY_CARE_PROVIDER_SITE_OTHER): Payer: BC Managed Care – PPO | Admitting: Orthopedic Surgery

## 2022-09-26 ENCOUNTER — Encounter: Payer: Self-pay | Admitting: Orthopedic Surgery

## 2022-09-26 VITALS — BP 115/67 | HR 67 | Temp 98.8°F | Ht 60.0 in | Wt 132.0 lb

## 2022-09-26 DIAGNOSIS — M5412 Radiculopathy, cervical region: Secondary | ICD-10-CM

## 2022-09-26 DIAGNOSIS — Z9889 Other specified postprocedural states: Secondary | ICD-10-CM

## 2022-09-26 DIAGNOSIS — Z09 Encounter for follow-up examination after completed treatment for conditions other than malignant neoplasm: Secondary | ICD-10-CM

## 2022-09-26 NOTE — Progress Notes (Addendum)
   REFERRING PHYSICIAN:  No referring provider defined for this encounter.  DOS: 09/14/22  C6-C7 arthroplasty  HISTORY OF PRESENT ILLNESS: [name] is 2 weeks status post C6-C7 arthroplasty. Given robaxin and oxycocone on discharge from the hospital.   She called last week with irritation/rash around incision. She removed the dermabond and continue with itching. Medrol dose pack called in. She was doing very well otherwise with minimal pain.   Her preop pain is much better. She has minimal neck pain at night. No arm pain. Numbness in left arm is gone!  Medrol dose pack helped the bumps go away, but red rash around the incision has gotten worse. She has burning and itching. No relief with OTC benadryl. Triamcinolone cream made burning worse. No drainage noted. No fevers or chills.    PHYSICAL EXAMINATION:  NEUROLOGICAL:  General: In no acute distress.   Awake, alert, oriented to person, place, and time.  Pupils equal round and reactive to light.  Facial tone is symmetric.    Strength: Side Biceps Triceps Deltoid Interossei Grip Wrist Ext. Wrist Flex.  R L Incision c/d/I. She has red rash anterior neck. No fluctuance noted. Rash is tender to touch. No swelling noted.   Imaging:  Nothing new to review.   Assessment / Plan: JARED CAHN is doing well s/p above surgery except for above pain associated with rash. Her preop symptoms are much better.  Treatment options reviewed with patient and following plan made:   - We discussed activity escalation and I have advised the patient to lift up to 10 pounds until 6 weeks after surgery (until follow up with Dr. Myer Haff).   - Reviewed wound care.  - Finish out medrol dose pack.  - Will review with Dr. Myer Haff and message her. May need to send in hydroxyzine for her.  - Follow up as scheduled in 4 weeks and prn.   Advised to contact the office if any questions or concerns arise.   ADDENDUM 09/26/22:   Reviewed with Dr. Myer Haff. He agrees with trial of hydroxyzine. Will send to her pharmacy. He thinks that this will continue to improve over time, but can will also offer referral to dermatology. Patient sent a message.   Drake Leach PA-C Dept of Neurosurgery

## 2022-09-27 ENCOUNTER — Encounter: Payer: BC Managed Care – PPO | Admitting: Orthopedic Surgery

## 2022-09-27 DIAGNOSIS — M5412 Radiculopathy, cervical region: Secondary | ICD-10-CM

## 2022-09-27 DIAGNOSIS — Z9889 Other specified postprocedural states: Secondary | ICD-10-CM

## 2022-10-22 ENCOUNTER — Ambulatory Visit
Admission: RE | Admit: 2022-10-22 | Discharge: 2022-10-22 | Disposition: A | Discharge status: Home / Self Care | Payer: BC Managed Care – PPO | Source: Ambulatory Visit | Attending: Neurosurgery | Admitting: Neurosurgery

## 2022-10-22 ENCOUNTER — Other Ambulatory Visit: Payer: Self-pay

## 2022-10-22 ENCOUNTER — Ambulatory Visit
Admission: RE | Admit: 2022-10-22 | Discharge: 2022-10-22 | Disposition: A | Discharge status: Home / Self Care | Payer: BC Managed Care – PPO | Attending: Neurosurgery | Admitting: Neurosurgery

## 2022-10-22 DIAGNOSIS — M5412 Radiculopathy, cervical region: Secondary | ICD-10-CM

## 2022-10-23 ENCOUNTER — Ambulatory Visit (INDEPENDENT_AMBULATORY_CARE_PROVIDER_SITE_OTHER): Payer: BC Managed Care – PPO | Admitting: Neurosurgery

## 2022-10-23 ENCOUNTER — Encounter: Payer: Self-pay | Admitting: Neurosurgery

## 2022-10-23 VITALS — BP 105/74 | HR 71 | Temp 99.6°F | Wt 133.4 lb

## 2022-10-23 DIAGNOSIS — M5412 Radiculopathy, cervical region: Secondary | ICD-10-CM

## 2022-10-23 DIAGNOSIS — Z09 Encounter for follow-up examination after completed treatment for conditions other than malignant neoplasm: Secondary | ICD-10-CM

## 2022-10-23 DIAGNOSIS — Z9889 Other specified postprocedural states: Secondary | ICD-10-CM

## 2022-10-23 NOTE — Progress Notes (Signed)
   REFERRING PHYSICIAN:  Lynnea Ferrier, Md [address] [facility] [place],  [place] [address]  DOS: 09/14/22  C6-C7 arthroplasty  HISTORY OF PRESENT ILLNESS: ELICA ZOLL is status post C6-C7 arthroplasty.   Her symptoms from surgery are much improved.  Her skin irritation has improved but has not gone away.  PHYSICAL EXAMINATION:  NEUROLOGICAL:  General: In no acute distress.   Awake, alert, oriented to person, place, and time.  Pupils equal round and reactive to light.  Facial tone is symmetric.    Strength: Side Biceps Triceps Deltoid Interossei Grip Wrist Ext. Wrist Flex.  R 5 5 5 5 5 5 5   L 5 5 5 5 5 5 5    Incision c/d/I. She has red rash anterior neck. No fluctuance noted. Rash is tender to touch. No swelling noted.   Imaging:  No complications noted  Assessment / Plan: NAIDELY BUECHEL is doing well s/p above surgery.  Her skin irritation has improved but has not completely gone away.  She is under management by dermatologist for this.  We reviewed her activity limitations.  She is released to begin lifting up to 25 pounds and begin exercising.  She will go on a trip next week with her family.  We did discuss that she will be riding a boat.  I think that it will probably be fine.  We will see her back in clinic in the coming weeks.     Venetia Night MD Dept of Neurosurgery

## 2022-11-30 ENCOUNTER — Other Ambulatory Visit: Payer: Self-pay

## 2022-11-30 DIAGNOSIS — M5412 Radiculopathy, cervical region: Secondary | ICD-10-CM

## 2022-11-30 NOTE — Addendum Note (Signed)
Addended by: Ernie Hew on: 11/30/2022 04:20 PM   Modules accepted: Orders

## 2022-12-03 ENCOUNTER — Encounter: Payer: Self-pay | Admitting: Neurosurgery

## 2022-12-04 ENCOUNTER — Encounter: Payer: BC Managed Care – PPO | Admitting: Orthopedic Surgery

## 2022-12-10 ENCOUNTER — Ambulatory Visit
Admission: RE | Admit: 2022-12-10 | Discharge: 2022-12-10 | Disposition: A | Discharge status: Home / Self Care | Payer: BC Managed Care – PPO | Attending: Orthopedic Surgery | Admitting: Orthopedic Surgery

## 2022-12-10 ENCOUNTER — Ambulatory Visit
Admission: RE | Admit: 2022-12-10 | Discharge: 2022-12-10 | Disposition: A | Discharge status: Home / Self Care | Payer: BC Managed Care – PPO | Source: Ambulatory Visit | Attending: Orthopedic Surgery | Admitting: Orthopedic Surgery

## 2022-12-10 DIAGNOSIS — M5412 Radiculopathy, cervical region: Secondary | ICD-10-CM | POA: Diagnosis not present

## 2022-12-11 ENCOUNTER — Encounter: Payer: Self-pay | Admitting: Neurosurgery

## 2022-12-11 ENCOUNTER — Ambulatory Visit (INDEPENDENT_AMBULATORY_CARE_PROVIDER_SITE_OTHER): Payer: BC Managed Care – PPO | Admitting: Neurosurgery

## 2022-12-11 VITALS — BP 122/74 | Ht 61.0 in | Wt 133.6 lb

## 2022-12-11 DIAGNOSIS — L7682 Other postprocedural complications of skin and subcutaneous tissue: Secondary | ICD-10-CM

## 2022-12-11 DIAGNOSIS — Z09 Encounter for follow-up examination after completed treatment for conditions other than malignant neoplasm: Secondary | ICD-10-CM

## 2022-12-11 DIAGNOSIS — M5412 Radiculopathy, cervical region: Secondary | ICD-10-CM

## 2022-12-11 NOTE — Progress Notes (Signed)
   REFERRING PHYSICIAN:  Lynnea Ferrier, Md [address] [facility] [place],  [place] [address]  DOS: 09/14/22  C6-C7 arthroplasty  HISTORY OF PRESENT ILLNESS: IYLA BALZARINI is status post C6-C7 arthroplasty.   Her symptoms from surgery are much improved.  Her skin irritation has improved but has not gone away. She continues to have irritation and stiffness around her incision. She has tried topical steroids and botox around the incision.  The botox helped but the steroids have not been helpful.  PHYSICAL EXAMINATION:  NEUROLOGICAL:  General: In no acute distress.   Awake, alert, oriented to person, place, and time.  Pupils equal round and reactive to light.  Facial tone is symmetric.    Strength: Side Biceps Triceps Deltoid Interossei Grip Wrist Ext. Wrist Flex.  R 5 5 5 5 5 5 5   L 5 5 5 5 5 5 5    Incision c/d/I. She has some tenderness to palpation of incision. The redness has improved but not gone away.  Imaging:  No complications noted  Assessment / Plan: ANYELIN MOGLE is doing well s/p above surgery.  Unfortunately she continues to have some issues with her incisoion.  I would like to send her to a plastic surgeon for evaluation.   I will see her back in 6 months.  Venetia Night MD Dept of Neurosurgery

## 2023-01-10 ENCOUNTER — Encounter: Payer: Self-pay | Admitting: Neurosurgery

## 2023-05-20 ENCOUNTER — Encounter: Payer: Self-pay | Admitting: Neurosurgery

## 2023-05-20 ENCOUNTER — Other Ambulatory Visit: Payer: Self-pay

## 2023-05-20 DIAGNOSIS — M5412 Radiculopathy, cervical region: Secondary | ICD-10-CM

## 2023-05-20 NOTE — Telephone Encounter (Signed)
Patient is scheduled for 05/21/2023 at 4pm and patient confirmed.

## 2023-05-21 ENCOUNTER — Ambulatory Visit
Admission: RE | Admit: 2023-05-21 | Discharge: 2023-05-21 | Disposition: A | Discharge status: Home / Self Care | Payer: BC Managed Care – PPO | Source: Ambulatory Visit | Attending: Neurosurgery | Admitting: Neurosurgery

## 2023-05-21 ENCOUNTER — Encounter: Payer: Self-pay | Admitting: Neurosurgery

## 2023-05-21 ENCOUNTER — Ambulatory Visit (INDEPENDENT_AMBULATORY_CARE_PROVIDER_SITE_OTHER): Payer: BC Managed Care – PPO | Admitting: Neurosurgery

## 2023-05-21 ENCOUNTER — Ambulatory Visit
Admission: RE | Admit: 2023-05-21 | Discharge: 2023-05-21 | Disposition: A | Discharge status: Home / Self Care | Payer: BC Managed Care – PPO | Attending: Neurosurgery | Admitting: Neurosurgery

## 2023-05-21 VITALS — BP 102/68 | Ht 61.0 in | Wt 133.0 lb

## 2023-05-21 DIAGNOSIS — M5412 Radiculopathy, cervical region: Secondary | ICD-10-CM | POA: Insufficient documentation

## 2023-05-21 DIAGNOSIS — Z9889 Other specified postprocedural states: Secondary | ICD-10-CM | POA: Diagnosis not present

## 2023-05-21 DIAGNOSIS — L7682 Other postprocedural complications of skin and subcutaneous tissue: Secondary | ICD-10-CM | POA: Diagnosis not present

## 2023-05-21 DIAGNOSIS — R200 Anesthesia of skin: Secondary | ICD-10-CM

## 2023-05-21 NOTE — Progress Notes (Signed)
   REFERRING PHYSICIAN:  Lynnea Ferrier, Md [address] [facility] [place],  [place] [address]  DOS: 09/14/22  C6-C7 arthroplasty  HISTORY OF PRESENT ILLNESS: [name] is status post C6-C7 arthroplasty.   Over the past 2 to 4 weeks, she has had onset of some discomfort in the left side of her neck.  When she goes to bed at night, she sometimes has numbness into her backs of her upper arms.  It is unclear whether it goes down to her fingers.  This has been bothering her to some degree.  She is more annoyed by this than in severe pain.  She rates her pain at approximately 3 out of 10.  Her incision has improved.  There are 2 bumps on the medial aspect of her incision.  She is seeing her dermatologist for this.   PHYSICAL EXAMINATION:  NEUROLOGICAL:  General: In no acute distress.   Awake, alert, oriented to person, place, and time.  Pupils equal round and reactive to light.  Facial tone is symmetric.    Strength: Side Biceps Triceps Deltoid Interossei Grip Wrist Ext. Wrist Flex.  R 5 5 5 5 5 5 5   L 5 5 5 5 5 5 5    Incision c/d/I.  She has 2 palpable bumps.  It is unclear what is the cause.   Imaging:  No complications noted  Assessment / Plan: [name] is doing well s/p above surgery.   She has had a recent setback with some discomfort in her neck and numbness in her upper arms.  Before pursuing imaging, I will start her on some exercises and have recommended ibuprofen 600 mg 3 times a day for the next 2 weeks.  We will touch base via telephone in approximately 3 weeks to see how she is doing.  We discussed that it would be difficult to tell whether the previously operated level might have residual stenosis if her symptoms continued.  Her current symptoms are not clear enough to define which dermatome is affected.  As such, the decision between MRI scan and CT myelogram is difficult.  I am fine with her having her scar treated by her dermatologist.  We  discussed that the only way to find out what is in the bumps would be to open the incision, which I would not recommend at this point.    Venetia Night MD Dept of Neurosurgery

## 2023-06-13 ENCOUNTER — Ambulatory Visit: Payer: BC Managed Care – PPO | Admitting: Neurosurgery

## 2023-06-13 ENCOUNTER — Ambulatory Visit (INDEPENDENT_AMBULATORY_CARE_PROVIDER_SITE_OTHER): Payer: BC Managed Care – PPO | Admitting: Neurosurgery

## 2023-06-13 DIAGNOSIS — Z9889 Other specified postprocedural states: Secondary | ICD-10-CM

## 2023-06-13 NOTE — Progress Notes (Signed)
   REFERRING PHYSICIAN:  Fernande Ophelia JINNY Douglas, Md [address] [facility] [place],  [place] [address]  DOS: 09/14/22  C6-C7 arthroplasty  HISTORY OF PRESENT ILLNESS: TAMANTHA SALINE is status post C6-C7 arthroplasty.  She has been using a Theragran which is helping.  She also tried ibuprofen  which helped but caused some stomach upset.  She has been able to return to her prior activities.    PHYSICAL EXAMINATION:  Telephone visit   Imaging:  No complications noted  Assessment / Plan: AMMARA RAJ is doing well s/p above surgery.  Her current regimen of Thera-Band plus ibuprofen  has been helpful.  She will try that for now.  I did discuss with her that there are some options for NSAIDs that do not cause this much stomach upset such as Celebrex or meloxicam.  If she begins to have stomach upset again, she will let me know and may try different medication.  This visit was performed via telephone.  Patient location: home Provider location: office  I spent a total of 4 minutes non-face-to-face activities for this visit on the date of this encounter including review of current clinical condition and response to treatment.  The patient is aware of and accepts the limits of this telehealth visit.   Reeves Daisy MD Dept of Neurosurgery

## 2023-10-24 IMAGING — MR MR PELVIS W/O CM
5 of 7 series · 31 of 48 positions shown · non-contrast
Comparison: None.

CLINICAL DATA: Left lower pelvic and leg pain for the past year. No
injury.

EXAM:
MRI PELVIS WITHOUT CONTRAST
TECHNIQUE: Multiplanar multisequence MR imaging of the pelvis was performed. No
intravenous contrast was administered.

[Series 2: T2 fat-sat · axial · right · 4.0mm · 0.52mm/px · z∈[-47,+132]mm · 6 of 33 slices shown (1 of 2)]
[im 1/33]
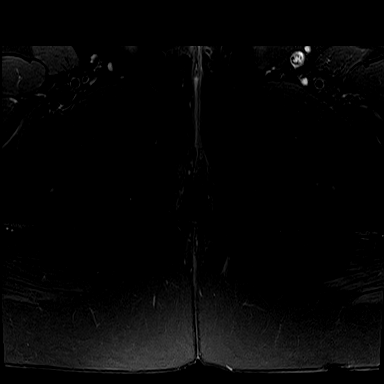
[im 7/33]
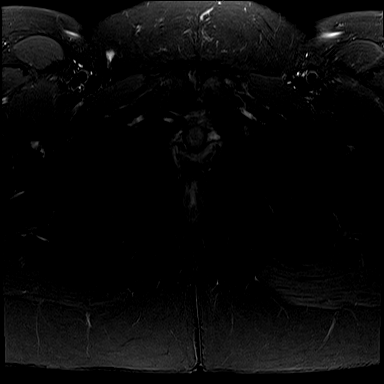
[im 13/33]
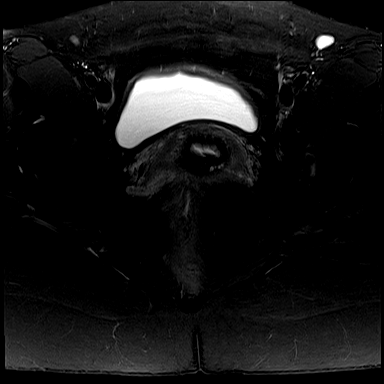
[im 20/33]
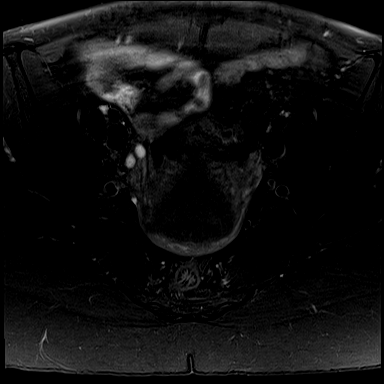
[im 26/33]
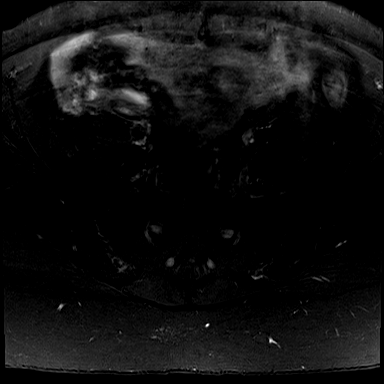
[im 33/33]
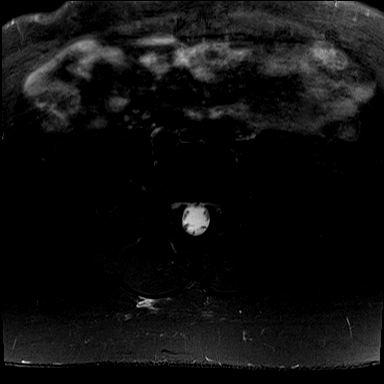

[Series 3: STIR · coronal · right · 3.0mm · 1.09mm/px · 8 of 42 slices shown]
[im 1/42]
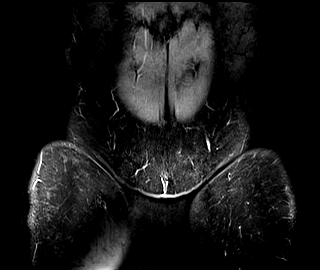
[im 6/42]
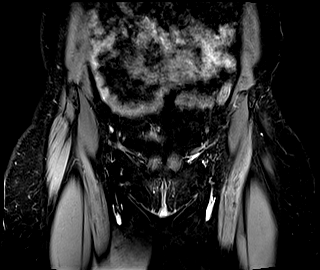
[im 12/42]
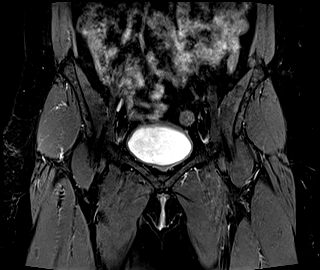
[im 18/42]
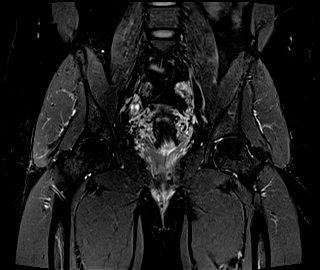
[im 24/42]
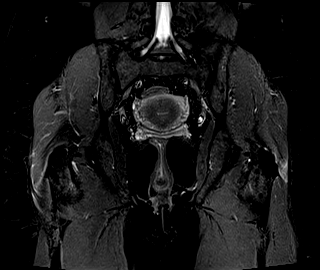
[im 30/42]
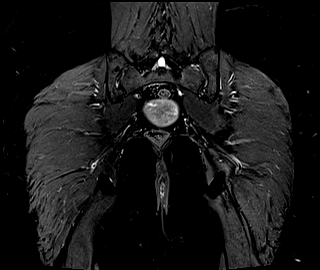
[im 36/42]
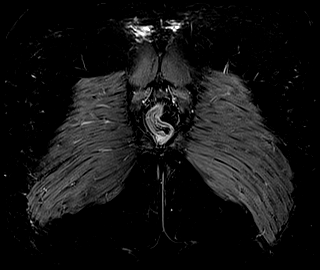
[im 42/42]
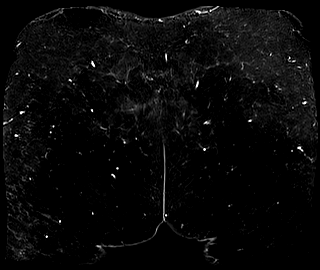

[Series 4: T1 · coronal · right · 3.0mm · 0.78mm/px · 8 of 42 slices shown]
[im 1/42]
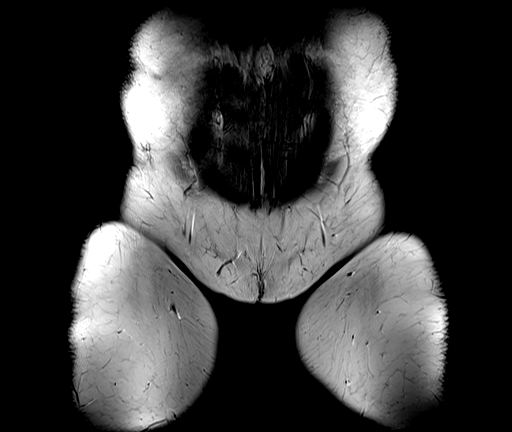
[im 6/42]
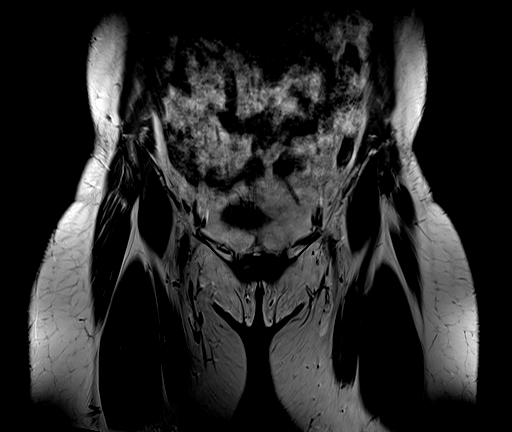
[im 12/42]
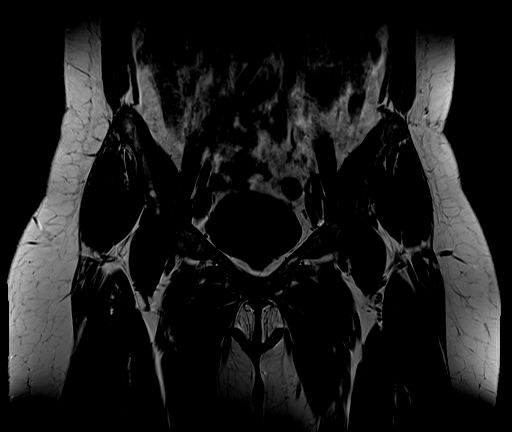
[im 18/42]
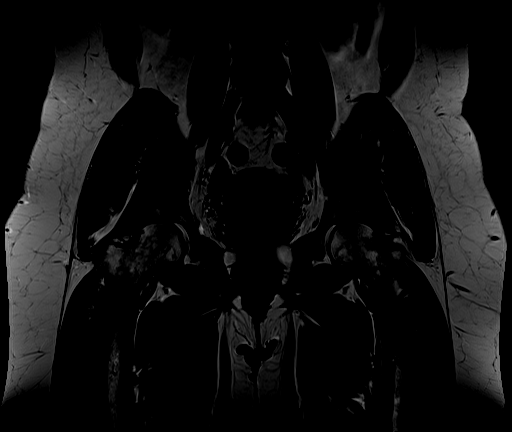
[im 24/42]
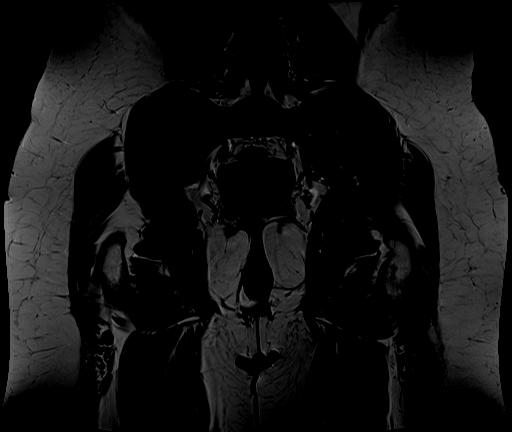
[im 30/42]
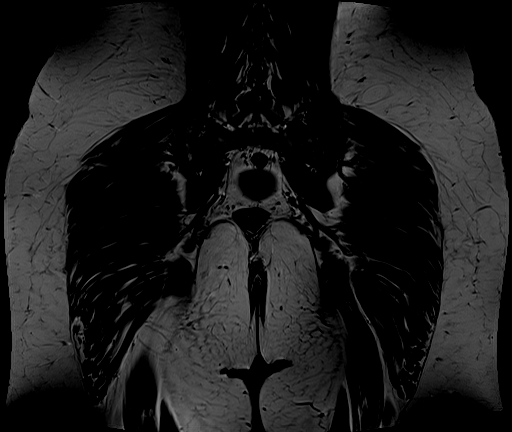
[im 36/42]
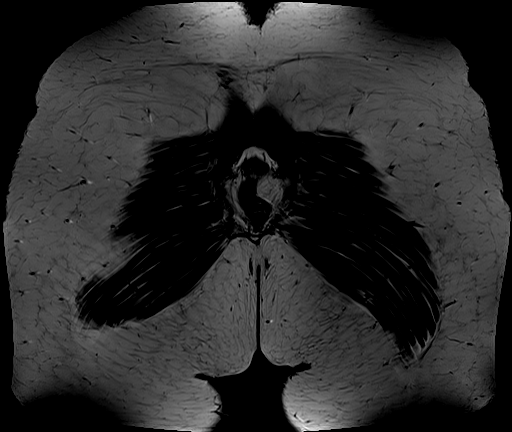
[im 42/42]
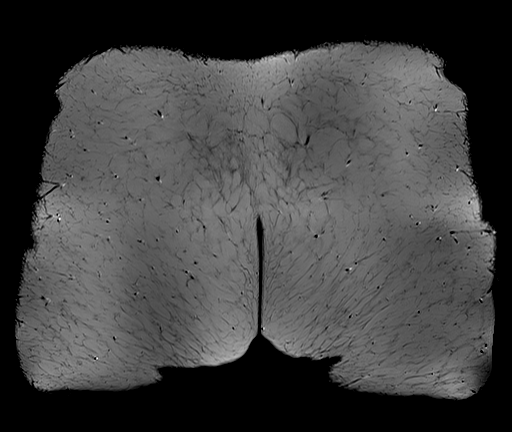

[Series 5: T2 fat-sat · sagittal · right · 4.0mm · 0.62mm/px · 8 of 45 slices shown (2 of 2)]
[im 1/45]
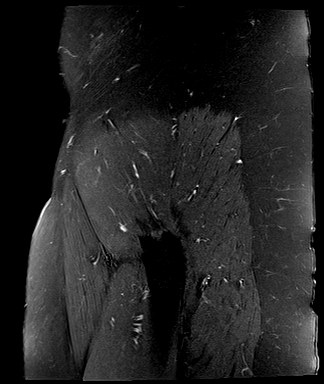
[im 7/45]
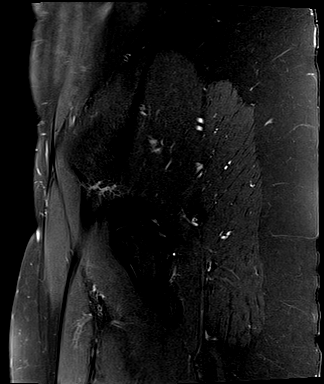
[im 13/45]
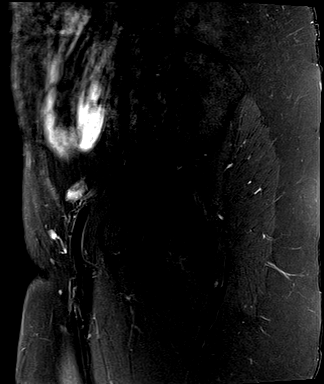
[im 19/45]
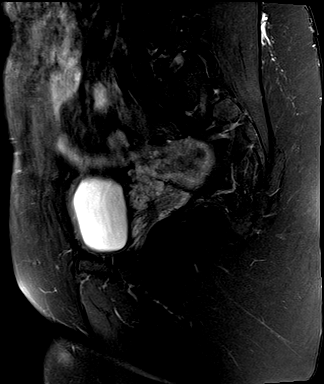
[im 26/45]
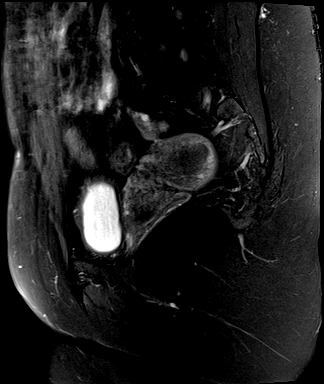
[im 32/45]
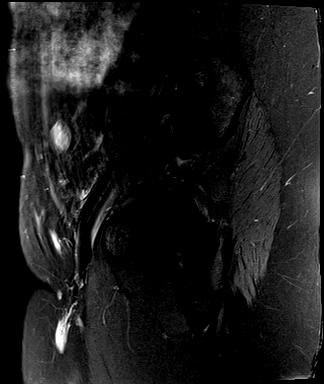
[im 38/45]
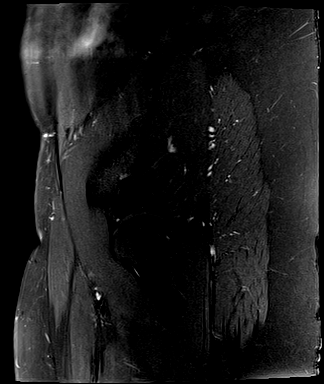
[im 45/45]
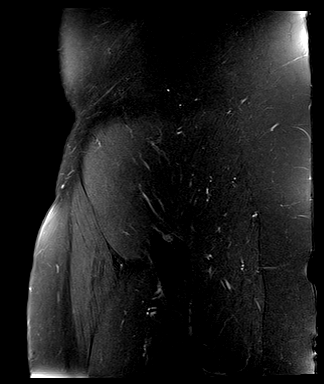

[Series 6: PD fat-sat · coronal · right · 4.0mm · 0.31mm/px · 1 of 34 slices shown]
[im 1/34]
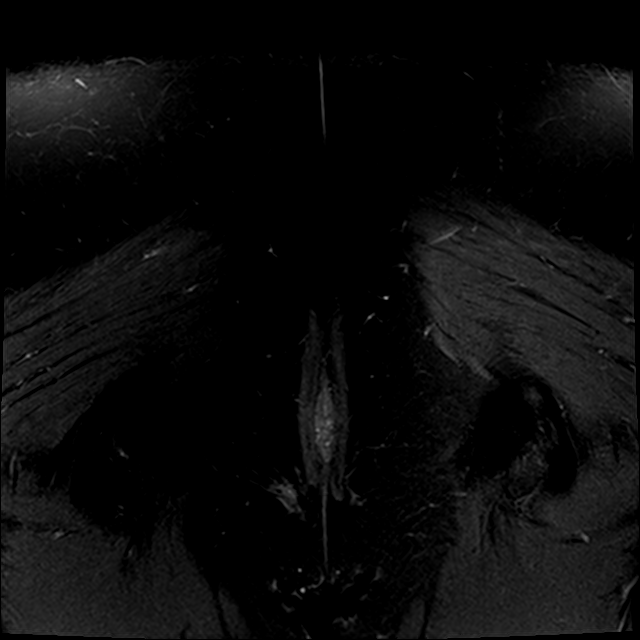

[31 of 48 positions shown; findings below may reference images not displayed]

FINDINGS: Bones: There is no evidence of acute fracture, dislocation or
avascular necrosis. No focal bone lesion. Mild inflammatory change
of the pubic symphysis with trace symphyseal fluid and asymmetric
marrow edema and spurring on the left. The sacroiliac joints are
unremarkable. Small Schmorl's node involving the L5 inferior
endplate.

Articular cartilage and labrum

Articular cartilage: No focal chondral defect or subchondral signal
abnormality identified.

Labrum: Partial tear of the right anterior superior labrum (series
5, images 8-9).

Joint or bursal effusion

Joint effusion: No significant hip joint effusion.

Bursae: No focal periarticular fluid collection.

Muscles and tendons

Muscles and tendons: The bilateral rectus abdominus-adductor longus
aponeuroses are intact. The visualized gluteus, hamstring and
iliopsoas tendons appear normal. No muscle edema or atrophy.

Other findings

Miscellaneous: The visualized internal pelvic contents appear
unremarkable. 1.3 cm Bartholin gland cyst on the right.
IMPRESSION: 1. Mild osteitis pubis.
2. Partial tear of the right anterior superior labrum.
